# Patient Record
Sex: Female | Born: 1990 | Hispanic: No | Marital: Married | Smoking: Never smoker
Health system: Southern US, Community
[De-identification: ages and names within clinical notes are randomized; demographics above are authoritative.]

## PROBLEM LIST (undated history)

## (undated) ENCOUNTER — Inpatient Hospital Stay (HOSPITAL_COMMUNITY): Payer: Self-pay

## (undated) DIAGNOSIS — Z3493 Encounter for supervision of normal pregnancy, unspecified, third trimester: Secondary | ICD-10-CM

## (undated) DIAGNOSIS — D649 Anemia, unspecified: Secondary | ICD-10-CM

## (undated) HISTORY — PX: NO PAST SURGERIES: SHX2092

---
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
## 2011-09-02 ENCOUNTER — Other Ambulatory Visit (HOSPITAL_COMMUNITY): Payer: Self-pay | Admitting: Obstetrics and Gynecology

## 2011-09-02 ENCOUNTER — Ambulatory Visit (HOSPITAL_COMMUNITY)
Admission: RE | Admit: 2011-09-02 | Discharge: 2011-09-02 | Disposition: A | Discharge status: Home / Self Care | Payer: Medicaid Other | Source: Ambulatory Visit | Attending: Obstetrics and Gynecology | Admitting: Obstetrics and Gynecology

## 2011-09-02 DIAGNOSIS — R1032 Left lower quadrant pain: Secondary | ICD-10-CM | POA: Insufficient documentation

## 2011-09-02 DIAGNOSIS — O209 Hemorrhage in early pregnancy, unspecified: Secondary | ICD-10-CM | POA: Insufficient documentation

## 2011-09-02 DIAGNOSIS — R52 Pain, unspecified: Secondary | ICD-10-CM

## 2011-09-02 DIAGNOSIS — O3680X0 Pregnancy with inconclusive fetal viability, not applicable or unspecified: Secondary | ICD-10-CM | POA: Insufficient documentation

## 2011-10-10 LAB — OB RESULTS CONSOLE HIV ANTIBODY (ROUTINE TESTING): HIV: NONREACTIVE

## 2011-10-10 LAB — OB RESULTS CONSOLE ABO/RH: RH Type: POSITIVE

## 2011-10-10 LAB — OB RESULTS CONSOLE ANTIBODY SCREEN: Antibody Screen: NEGATIVE

## 2011-10-10 LAB — OB RESULTS CONSOLE RUBELLA ANTIBODY, IGM: Rubella: IMMUNE

## 2011-10-10 LAB — OB RESULTS CONSOLE GC/CHLAMYDIA: Gonorrhea: NEGATIVE

## 2011-10-13 LAB — OB RESULTS CONSOLE GBS: GBS: POSITIVE

## 2012-03-21 NOTE — L&D Delivery Note (Signed)
Delivery Note Pt pushed x 1 hr 30 min, for delivery.  At 11:23 PM a viable and healthy female was delivered via Vaginal, Spontaneous Delivery (Presentation: ; R Occiput Posterior).  APGAR: 8, 9; weight P.   Placenta status: Intact, Spontaneous.  Cord:  with the following complications: NONE  Anesthesia: Epidural  Episiotomy: none Lacerations: 1st degree Suture Repair: 3.0 vicryl rapide Est. Blood Loss (mL): 400  Mom to postpartum.  Baby to stay with mommy.  BOVARD,Walsie Smeltz 05/04/2012, 11:50 PM  Br/Bo; B+ ; POPs; RI

## 2012-04-29 ENCOUNTER — Encounter (HOSPITAL_COMMUNITY): Payer: Self-pay | Admitting: *Deleted

## 2012-04-29 ENCOUNTER — Inpatient Hospital Stay (HOSPITAL_COMMUNITY)
Admission: AD | Admit: 2012-04-29 | Discharge: 2012-04-29 | Disposition: A | Discharge status: Home / Self Care | Payer: Medicaid Other | Source: Ambulatory Visit | Attending: Obstetrics and Gynecology | Admitting: Obstetrics and Gynecology

## 2012-04-29 DIAGNOSIS — O479 False labor, unspecified: Secondary | ICD-10-CM | POA: Insufficient documentation

## 2012-04-29 NOTE — Treatment Plan (Signed)
Dr Jackelyn Knife notified of patient.  No cervical change, with continued contractions.  Pt may be discharged home with labor precautions

## 2012-04-29 NOTE — MAU Note (Signed)
Been having contractions since last night

## 2012-05-04 ENCOUNTER — Inpatient Hospital Stay (HOSPITAL_COMMUNITY)
Admission: AD | Admit: 2012-05-04 | Discharge: 2012-05-07 | DRG: 775 | Disposition: A | Discharge status: Home / Self Care | Payer: Medicaid Other | Source: Ambulatory Visit | Attending: Obstetrics and Gynecology | Admitting: Obstetrics and Gynecology

## 2012-05-04 ENCOUNTER — Encounter (HOSPITAL_COMMUNITY): Payer: Self-pay | Admitting: Anesthesiology

## 2012-05-04 ENCOUNTER — Inpatient Hospital Stay (HOSPITAL_COMMUNITY): Payer: Medicaid Other | Admitting: Anesthesiology

## 2012-05-04 DIAGNOSIS — O99892 Other specified diseases and conditions complicating childbirth: Secondary | ICD-10-CM | POA: Diagnosis present

## 2012-05-04 DIAGNOSIS — Z22330 Carrier of Group B streptococcus: Secondary | ICD-10-CM

## 2012-05-04 LAB — CBC
Hemoglobin: 11.4 g/dL — ABNORMAL LOW (ref 12.0–15.0)
MCH: 28.9 pg (ref 26.0–34.0)
RBC: 3.94 MIL/uL (ref 3.87–5.11)
WBC: 14.3 10*3/uL — ABNORMAL HIGH (ref 4.0–10.5)

## 2012-05-04 MED ORDER — LACTATED RINGERS IV SOLN: 500.0000 mL | Freq: Once | INTRAVENOUS | Status: DC

## 2012-05-04 MED ORDER — PHENYLEPHRINE 40 MCG/ML (10ML) SYRINGE FOR IV PUSH (FOR BLOOD PRESSURE SUPPORT)
80.0000 ug | PREFILLED_SYRINGE | INTRAVENOUS | Status: DC | PRN
  Filled 2012-05-04: qty 5
  Filled 2012-05-04: qty 2

## 2012-05-04 MED ORDER — FENTANYL 2.5 MCG/ML BUPIVACAINE 1/10 % EPIDURAL INFUSION (WH - ANES)
INTRAMUSCULAR | Status: DC | PRN
  Administered 2012-05-04: 14 mL/h via EPIDURAL

## 2012-05-04 MED ORDER — OXYCODONE-ACETAMINOPHEN 5-325 MG PO TABS: 1.0000 | ORAL_TABLET | ORAL | Status: DC | PRN

## 2012-05-04 MED ORDER — ONDANSETRON HCL 4 MG/2ML IJ SOLN: 4.0000 mg | Freq: Four times a day (QID) | INTRAMUSCULAR | Status: DC | PRN

## 2012-05-04 MED ORDER — OXYTOCIN 40 UNITS IN LACTATED RINGERS INFUSION - SIMPLE MED: 62.5000 mL/h | INTRAVENOUS | Status: DC

## 2012-05-04 MED ORDER — LIDOCAINE HCL (PF) 1 % IJ SOLN
INTRAMUSCULAR | Status: DC | PRN
  Administered 2012-05-04 (×2): 4 mL

## 2012-05-04 MED ORDER — PHENYLEPHRINE 40 MCG/ML (10ML) SYRINGE FOR IV PUSH (FOR BLOOD PRESSURE SUPPORT)
80.0000 ug | PREFILLED_SYRINGE | INTRAVENOUS | Status: DC | PRN
  Filled 2012-05-04: qty 2

## 2012-05-04 MED ORDER — ACETAMINOPHEN 325 MG PO TABS: 650.0000 mg | ORAL_TABLET | ORAL | Status: DC | PRN

## 2012-05-04 MED ORDER — EPHEDRINE 5 MG/ML INJ
10.0000 mg | INTRAVENOUS | Status: DC | PRN
  Filled 2012-05-04: qty 2

## 2012-05-04 MED ORDER — DIPHENHYDRAMINE HCL 50 MG/ML IJ SOLN: 12.5000 mg | INTRAMUSCULAR | Status: DC | PRN

## 2012-05-04 MED ORDER — EPHEDRINE 5 MG/ML INJ
10.0000 mg | INTRAVENOUS | Status: DC | PRN
  Filled 2012-05-04: qty 4
  Filled 2012-05-04: qty 2

## 2012-05-04 MED ORDER — LIDOCAINE HCL (PF) 1 % IJ SOLN
30.0000 mL | INTRAMUSCULAR | Status: DC | PRN
  Filled 2012-05-04 (×2): qty 30

## 2012-05-04 MED ORDER — CITRIC ACID-SODIUM CITRATE 334-500 MG/5ML PO SOLN: 30.0000 mL | ORAL | Status: DC | PRN

## 2012-05-04 MED ORDER — AMPICILLIN SODIUM 2 G IJ SOLR
2.0000 g | Freq: Four times a day (QID) | INTRAMUSCULAR | Status: DC
  Administered 2012-05-04: 2 g via INTRAVENOUS
  Filled 2012-05-04 (×2): qty 2000

## 2012-05-04 MED ORDER — FENTANYL 2.5 MCG/ML BUPIVACAINE 1/10 % EPIDURAL INFUSION (WH - ANES)
14.0000 mL/h | INTRAMUSCULAR | Status: DC
Start: 2012-05-04 — End: 2012-05-07
  Filled 2012-05-04: qty 125

## 2012-05-04 MED ORDER — LACTATED RINGERS IV SOLN
INTRAVENOUS | Status: DC
  Administered 2012-05-04: 21:00:00 via INTRAVENOUS

## 2012-05-04 MED ORDER — LACTATED RINGERS IV SOLN: 500.0000 mL | INTRAVENOUS | Status: DC | PRN

## 2012-05-04 MED ORDER — OXYTOCIN 40 UNITS IN LACTATED RINGERS INFUSION - SIMPLE MED
1.0000 m[IU]/min | INTRAVENOUS | Status: DC
  Filled 2012-05-04: qty 1000

## 2012-05-04 MED ORDER — OXYTOCIN BOLUS FROM INFUSION
500.0000 mL | INTRAVENOUS | Status: DC
  Administered 2012-05-04: 500 mL via INTRAVENOUS

## 2012-05-04 MED ORDER — OXYTOCIN 10 UNIT/ML IJ SOLN
40.0000 [IU] | INTRAVENOUS | Status: DC
  Filled 2012-05-04: qty 4

## 2012-05-04 MED ORDER — TERBUTALINE SULFATE 1 MG/ML IJ SOLN: 0.2500 mg | Freq: Once | INTRAMUSCULAR | Status: AC | PRN

## 2012-05-04 MED ORDER — IBUPROFEN 600 MG PO TABS
600.0000 mg | ORAL_TABLET | Freq: Four times a day (QID) | ORAL | Status: DC | PRN
  Administered 2012-05-05: 600 mg via ORAL
  Filled 2012-05-04: qty 1

## 2012-05-04 MED ORDER — FLEET ENEMA 7-19 GM/118ML RE ENEM: 1.0000 | ENEMA | RECTAL | Status: DC | PRN

## 2012-05-04 NOTE — H&P (Signed)
[name] is a 22 y.o. female G2P1001 at 64+ with ROM and contractions.  Uncomplicated PNC except consanguinuity (parents are 2nd cousins).  +FM, no VB.    Maternal Medical History:  Reason for admission: Rupture of membranes and contractions.   Contractions: Frequency: regular.   Perceived severity is strong.    Fetal activity: Perceived fetal activity is normal.    Prenatal complications: no prenatal complications   OB History   Grav Para Term Preterm Abortions TAB SAB Ect Mult Living   2 1 1  0 0 0 0 0 0 1    G1 4/12 7#1, female SVD; G2 present; no pap, no STDs  Past Medical History  Diagnosis Date  . Medical history non-contributory    No past surgical history on file. Family History: unknown CA in pgf Social History:  reports that she has never smoked. She has never used smokeless tobacco. She reports that she does not drink alcohol or use illicit drugs.married, [facility]   Prenatal Transfer Tool  Maternal Diabetes: No Genetic Screening: Normal Maternal Ultrasounds/Referrals: Normal Fetal Ultrasounds or other Referrals:  None Maternal Substance Abuse:  No Significant Maternal Medications:  None Significant Maternal Lab Results:  Lab values include: Group B Strep positive Other Comments:  Mother and FOB are 2nd cousins  Review of Systems  Constitutional: Negative.   HENT: Negative.   Eyes: Negative.   Respiratory: Negative.   Cardiovascular: Negative.   Gastrointestinal: Negative.   Genitourinary: Negative.   Musculoskeletal: Negative.   Skin: Negative.   Neurological: Negative.   Psychiatric/Behavioral: Negative.     Dilation: 8 Effacement (%): 100 Station: 0 Exam by:: Rayburn Ma Blood pressure 148/87, temperature 98.4 F (36.9 C), temperature source Oral. Maternal Exam:  Uterine Assessment: Contraction frequency is regular.   Abdomen: Fundal height is appropriate for gestation.   Estimated fetal weight is 7-8#.   Fetal presentation:  vertex  Pelvis: adequate for delivery.   Cervix: Cervix evaluated by digital exam.     Physical Exam  Constitutional: She is oriented to person, place, and time. She appears well-developed and well-nourished.  HENT:  Head: Normocephalic and atraumatic.  Cardiovascular: Normal rate and regular rhythm.   Respiratory: Effort normal and breath sounds normal. No respiratory distress.  GI: Soft. Bowel sounds are normal. There is no tenderness.  Musculoskeletal: Normal range of motion.  Neurological: She is alert and oriented to person, place, and time.  Skin: Skin is warm and dry.  Psychiatric: She has a normal mood and affect. Her behavior is normal.    Prenatal labs: ABO, Rh: B/Positive/-- (07/22 0000) Antibody: Negative (07/22 0000) Rubella: Immune (07/22 0000) RPR: Nonreactive (07/22 0000)  HBsAg: Negative (07/22 0000)  HIV: Non-reactive (07/22 0000)  GBS: Positive (07/25 0000)   Hgb 12.5/ Ur Cx + GBBS/ Plt 309K/ GC neg/ Chl neg/ CF neg/ First Tri and AFP WNL/ glucola WNL  EDC 2/12 - ant plac, nl anat, female  Declines TDAP and flu  Assessment/Plan: 21yo G2P1001 at 40+ in active labor Epidural if possible GBBS + - ampicillin Expect SVD   BOVARD,Destine Zirkle 05/04/2012, 8:02 PM

## 2012-05-04 NOTE — Progress Notes (Signed)
To 169 via stretcher. Family with pt.

## 2012-05-04 NOTE — MAU Note (Signed)
Pt came in to MAU via w/c. Very uncomfortable and water broke on way to room. Mod amt clear fld. Dr Ellyn Hack called by Jeani Sow NP and admit orders received.. BS called and report given to [facility].

## 2012-05-04 NOTE — Anesthesia Preprocedure Evaluation (Signed)
Anesthesia Evaluation  Patient identified by MRN, date of birth, ID band Patient awake    Reviewed: Allergy & Precautions, H&P , Patient's Chart, lab work & pertinent test results  Airway Mallampati: III TM Distance: >3 FB Neck ROM: full    Dental no notable dental hx. (+) Teeth Intact   Pulmonary neg pulmonary ROS,  breath sounds clear to auscultation  Pulmonary exam normal       Cardiovascular negative cardio ROS  Rhythm:regular Rate:Normal     Neuro/Psych negative neurological ROS  negative psych ROS   GI/Hepatic negative GI ROS, Neg liver ROS,   Endo/Other  negative endocrine ROS  Renal/GU negative Renal ROS  negative genitourinary   Musculoskeletal   Abdominal   Peds  Hematology negative hematology ROS (+)   Anesthesia Other Findings   Reproductive/Obstetrics (+) Pregnancy                           Anesthesia Physical Anesthesia Plan  ASA: II  Anesthesia Plan: Epidural   Post-op Pain Management:    Induction:   Airway Management Planned:   Additional Equipment:   Intra-op Plan:   Post-operative Plan:   Informed Consent: I have reviewed the patients History and Physical, chart, labs and discussed the procedure including the risks, benefits and alternatives for the proposed anesthesia with the patient or authorized representative who has indicated his/her understanding and acceptance.     Plan Discussed with: Anesthesiologist  Anesthesia Plan Comments:         Anesthesia Quick Evaluation

## 2012-05-04 NOTE — Progress Notes (Signed)
Monitors off for epidural placement. 

## 2012-05-04 NOTE — Anesthesia Procedure Notes (Signed)
Epidural Patient location during procedure: OB Start time: 05/04/2012 8:25 PM  Staffing Anesthesiologist: Marisa Hufstetler A. Performed by: anesthesiologist   Preanesthetic Checklist Completed: patient identified, site marked, surgical consent, pre-op evaluation, timeout performed, IV checked, risks and benefits discussed and monitors and equipment checked  Epidural Patient position: left lateral decubitus Prep: site prepped and draped and DuraPrep Patient monitoring: continuous pulse ox and blood pressure Approach: midline Injection technique: LOR air  Needle:  Needle type: Tuohy  Needle gauge: 17 G Needle length: 9 cm and 9 Needle insertion depth: 5 cm cm Catheter type: closed end flexible Catheter size: 19 Gauge Catheter at skin depth: 10 cm Test dose: negative and Other  Assessment Events: blood not aspirated, injection not painful, no injection resistance, negative IV test and no paresthesia  Additional Notes Patient identified. Risks and benefits discussed including failed block, incomplete  Pain control, post dural puncture headache, nerve damage, paralysis, blood pressure Changes, nausea, vomiting, reactions to medications-both toxic and allergic and post Partum back pain. All questions were answered. Patient expressed understanding and wished to proceed. Sterile technique was used throughout procedure. Epidural site was Dressed with sterile barrier dressing. No paresthesias, signs of intravascular injection Or signs of intrathecal spread were encountered.  Patient was more comfortable after the epidural was dosed. Please see RN's note for documentation of vital signs and FHR which are stable.

## 2012-05-05 ENCOUNTER — Encounter (HOSPITAL_COMMUNITY): Payer: Self-pay | Admitting: *Deleted

## 2012-05-05 LAB — CBC
HCT: 32.6 % — ABNORMAL LOW (ref 36.0–46.0)
MCHC: 33.4 g/dL (ref 30.0–36.0)
MCV: 84.7 fL (ref 78.0–100.0)
RDW: 14.4 % (ref 11.5–15.5)

## 2012-05-05 MED ORDER — DIBUCAINE 1 % RE OINT
1.0000 "application " | TOPICAL_OINTMENT | RECTAL | Status: DC | PRN
  Administered 2012-05-05: 1 via RECTAL
  Filled 2012-05-05: qty 28

## 2012-05-05 MED ORDER — ONDANSETRON HCL 4 MG PO TABS: 4.0000 mg | ORAL_TABLET | ORAL | Status: DC | PRN

## 2012-05-05 MED ORDER — TETANUS-DIPHTH-ACELL PERTUSSIS 5-2.5-18.5 LF-MCG/0.5 IM SUSP: 0.5000 mL | Freq: Once | INTRAMUSCULAR | Status: DC

## 2012-05-05 MED ORDER — WITCH HAZEL-GLYCERIN EX PADS
1.0000 "application " | MEDICATED_PAD | CUTANEOUS | Status: DC | PRN
  Administered 2012-05-07: 1 via TOPICAL

## 2012-05-05 MED ORDER — LANOLIN HYDROUS EX OINT: TOPICAL_OINTMENT | CUTANEOUS | Status: DC | PRN

## 2012-05-05 MED ORDER — IBUPROFEN 600 MG PO TABS
600.0000 mg | ORAL_TABLET | Freq: Four times a day (QID) | ORAL | Status: DC
  Administered 2012-05-05 – 2012-05-07 (×9): 600 mg via ORAL
  Filled 2012-05-05 (×9): qty 1

## 2012-05-05 MED ORDER — SENNOSIDES-DOCUSATE SODIUM 8.6-50 MG PO TABS
2.0000 | ORAL_TABLET | Freq: Every day | ORAL | Status: DC
  Administered 2012-05-05: 2 via ORAL

## 2012-05-05 MED ORDER — DIPHENHYDRAMINE HCL 25 MG PO CAPS: 25.0000 mg | ORAL_CAPSULE | Freq: Four times a day (QID) | ORAL | Status: DC | PRN

## 2012-05-05 MED ORDER — ONDANSETRON HCL 4 MG/2ML IJ SOLN: 4.0000 mg | INTRAMUSCULAR | Status: DC | PRN

## 2012-05-05 MED ORDER — SIMETHICONE 80 MG PO CHEW: 80.0000 mg | CHEWABLE_TABLET | ORAL | Status: DC | PRN

## 2012-05-05 MED ORDER — OXYCODONE-ACETAMINOPHEN 5-325 MG PO TABS
1.0000 | ORAL_TABLET | ORAL | Status: DC | PRN
  Administered 2012-05-05 – 2012-05-06 (×5): 1 via ORAL
  Filled 2012-05-05 (×5): qty 1

## 2012-05-05 MED ORDER — BUTORPHANOL TARTRATE 1 MG/ML IJ SOLN: 2.0000 mg | INTRAMUSCULAR | Status: DC | PRN

## 2012-05-05 MED ORDER — PRENATAL MULTIVITAMIN CH
1.0000 | ORAL_TABLET | Freq: Every day | ORAL | Status: DC
  Administered 2012-05-05 – 2012-05-07 (×3): 1 via ORAL
  Filled 2012-05-05 (×3): qty 1

## 2012-05-05 MED ORDER — LACTATED RINGERS IV SOLN: INTRAVENOUS | Status: DC

## 2012-05-05 MED ORDER — ZOLPIDEM TARTRATE 5 MG PO TABS: 5.0000 mg | ORAL_TABLET | Freq: Every evening | ORAL | Status: DC | PRN

## 2012-05-05 MED ORDER — BENZOCAINE-MENTHOL 20-0.5 % EX AERO
1.0000 "application " | INHALATION_SPRAY | CUTANEOUS | Status: DC | PRN
  Administered 2012-05-05 – 2012-05-07 (×2): 1 via TOPICAL
  Filled 2012-05-05 (×2): qty 56

## 2012-05-05 NOTE — Progress Notes (Signed)
Post Partum Day 1 Subjective: no complaints, up ad lib, voiding, tolerating PO and nl lochia, pain controlled  Objective: Blood pressure 128/66, pulse 89, temperature 98.9 F (37.2 C), temperature source Oral, resp. rate 18, height 5\' 1"  (1.549 m), weight 78.926 kg (174 lb), SpO2 99.00%, unknown if currently breastfeeding.  Physical Exam:  General: alert and no distress Lochia: appropriate Uterine Fundus: firm   Recent Labs  05/04/12 1933 05/05/12 0515  HGB 11.4* 10.9*  HCT 33.6* 32.6*    Assessment/Plan: Plan for discharge tomorrow, Breastfeeding and Lactation consult.  Doing well.     LOS: 1 day   Evans,Gabriella Haidar 05/05/2012, 7:38 AM

## 2012-05-05 NOTE — Progress Notes (Signed)
O2 at 10 L/min applied Pushing with rope.

## 2012-05-05 NOTE — Progress Notes (Signed)
O2 off

## 2012-05-05 NOTE — Anesthesia Postprocedure Evaluation (Signed)
Anesthesia Post Note  Patient: [name]  Procedure(s) Performed: * No procedures listed *  Anesthesia type: Epidural  Patient location: Mother/Baby  Post pain: Pain level controlled  Post assessment: Post-op Vital signs reviewed  Last Vitals:  Filed Vitals:   05/05/12 0638  BP: 128/66  Pulse: 89  Temp: 37.2 C  Resp: 18    Post vital signs: Reviewed  Level of consciousness:alert  Complications: No apparent anesthesia complications

## 2012-05-06 MED ORDER — IBUPROFEN 800 MG PO TABS: 800.0000 mg | ORAL_TABLET | Freq: Three times a day (TID) | ORAL | Status: DC | PRN

## 2012-05-06 MED ORDER — PRENATAL MULTIVITAMIN CH: 1.0000 | ORAL_TABLET | Freq: Every day | ORAL | Status: DC

## 2012-05-06 MED ORDER — OXYCODONE-ACETAMINOPHEN 5-325 MG PO TABS: 1.0000 | ORAL_TABLET | Freq: Four times a day (QID) | ORAL | Status: DC | PRN

## 2012-05-06 NOTE — Discharge Summary (Addendum)
Obstetric Discharge Summary Reason for Admission: onset of labor Prenatal Procedures: none Intrapartum Procedures: spontaneous vaginal delivery Postpartum Procedures: none Complications-Operative and Postpartum: vaginal laceration Hemoglobin  Date Value Range Status  05/05/2012 10.9* 12.0 - 15.0 g/dL Final     HCT  Date Value Range Status  05/05/2012 32.6* 36.0 - 46.0 % Final    Physical Exam:  General: alert and no distress Lochia: appropriate Uterine Fundus: firm Perineum - swollen  Discharge Diagnoses: Term Pregnancy-delivered  Discharge Information: Date: 05/06/2012 Activity: pelvic rest Diet: routine Medications: PNV, Ibuprofen and Percocet Condition: stable Instructions: refer to practice specific booklet Discharge to: home Follow-up Information   Follow up with BOVARD,Olanna Percifield, MD. Schedule an appointment as soon as possible for a visit in 6 weeks.   Contact information:   [address] [place] [address] [phone]       Newborn Data: Live born female  Birth Weight: 7 lb 15 oz (3600 g) APGAR: 8, 9  Home with mother.  BOVARD,Timithy Arons 05/06/2012, 10:02 AM  Baby staying as baby patient, mother stayed with continued voiding dysfunction.  Replaced foley.  Will d/c 2/17 - if unable to void will d/c with catheter and macrobid daily.  Attempt to d/c tomorrow

## 2012-05-06 NOTE — Progress Notes (Addendum)
Post Partum Day 2 Subjective: no complaints, up ad lib, tolerating PO and nl lochia, pain controlled.  Difficulty voiding yesterday, foley replaced.  Trial before d/c today. states feels much better today.    Objective: Blood pressure 113/69, pulse 87, temperature 97.8 F (36.6 C), temperature source Oral, resp. rate 18, height 5\' 1"  (1.549 m), weight 78.926 kg (174 lb), SpO2 99.00%, unknown if currently breastfeeding.  Physical Exam:  General: alert and no distress Lochia: appropriate Uterine Fundus: firm   Recent Labs  05/04/12 1933 05/05/12 0515  HGB 11.4* 10.9*  HCT 33.6* 32.6*    Assessment/Plan: Discharge home, Breastfeeding and Lactation consult.  Baby not d/c'd, if patient can void, will stay as baby patient.  D/C with motrin/percocet/pnv, f/u 6 weeks.     LOS: 2 days   BOVARD,Rana Adorno 05/06/2012, 9:10 AM

## 2012-05-07 MED ORDER — NITROFURANTOIN MONOHYD MACRO 100 MG PO CAPS
100.0000 mg | ORAL_CAPSULE | ORAL | Status: DC
  Filled 2012-05-07 (×2): qty 1

## 2012-05-07 MED ORDER — NITROFURANTOIN MONOHYD MACRO 100 MG PO CAPS: 100.0000 mg | ORAL_CAPSULE | ORAL | Status: DC

## 2012-05-07 NOTE — Progress Notes (Signed)
Post Partum Day 3 Subjective: no complaints, up ad lib, tolerating PO and nl lochia, pain controlled, still with voiding dysfunction  Objective: Blood pressure 121/77, pulse 75, temperature 98.5 F (36.9 C), temperature source Oral, resp. rate 18, height 5\' 1"  (1.549 m), weight 78.926 kg (174 lb), SpO2 96.00%, unknown if currently breastfeeding.  Physical Exam:  General: alert and no distress Lochia: appropriate Uterine Fundus: firm    Recent Labs  05/04/12 1933 05/05/12 0515  HGB 11.4* 10.9*  HCT 33.6* 32.6*    Assessment/Plan: Discharge home with foley catheter if necessary.  Lactation consult/ breastfeeding.  D/c with motrin/percocet/pnv/macrobid.  F/u in 1-2 days.  Then 6 weeks for pp check.     LOS: 3 days   BOVARD,Adalaide Jaskolski 05/07/2012, 8:46 AM

## 2012-05-07 NOTE — Plan of Care (Signed)
Problem: Phase I Progression Outcomes Goal: Voiding adequately Outcome: Not Met (add Reason) Pt unable to void.  Patient being discharged with foley catheter, leg bag and instructions on how to remove on 03/07/2013.

## 2012-05-07 NOTE — Progress Notes (Signed)
Post discharge review completed. 

## 2012-05-18 ENCOUNTER — Telehealth (HOSPITAL_COMMUNITY): Payer: Self-pay | Admitting: *Deleted

## 2012-05-18 NOTE — Telephone Encounter (Signed)
Resolve episode 

## 2013-09-19 ENCOUNTER — Ambulatory Visit: Payer: Medicaid Other

## 2014-01-20 ENCOUNTER — Encounter (HOSPITAL_COMMUNITY): Payer: Self-pay | Admitting: *Deleted

## 2015-07-17 ENCOUNTER — Inpatient Hospital Stay (HOSPITAL_COMMUNITY): Payer: Medicaid Other

## 2015-07-17 ENCOUNTER — Encounter (HOSPITAL_COMMUNITY): Payer: Self-pay

## 2015-07-17 ENCOUNTER — Inpatient Hospital Stay (HOSPITAL_COMMUNITY)
Admission: AD | Admit: 2015-07-17 | Discharge: 2015-07-17 | Disposition: A | Discharge status: Home / Self Care | Payer: Medicaid Other | Source: Ambulatory Visit | Attending: Obstetrics and Gynecology | Admitting: Obstetrics and Gynecology

## 2015-07-17 DIAGNOSIS — O039 Complete or unspecified spontaneous abortion without complication: Secondary | ICD-10-CM

## 2015-07-17 DIAGNOSIS — O200 Threatened abortion: Secondary | ICD-10-CM | POA: Diagnosis not present

## 2015-07-17 DIAGNOSIS — O209 Hemorrhage in early pregnancy, unspecified: Secondary | ICD-10-CM

## 2015-07-17 DIAGNOSIS — O034 Incomplete spontaneous abortion without complication: Secondary | ICD-10-CM

## 2015-07-17 DIAGNOSIS — Z3A09 9 weeks gestation of pregnancy: Secondary | ICD-10-CM | POA: Diagnosis not present

## 2015-07-17 LAB — CBC
HEMATOCRIT: 34.2 % — AB (ref 36.0–46.0)
Hemoglobin: 11.8 g/dL — ABNORMAL LOW (ref 12.0–15.0)
MCH: 29.6 pg (ref 26.0–34.0)
MCHC: 34.5 g/dL (ref 30.0–36.0)
MCV: 85.9 fL (ref 78.0–100.0)
PLATELETS: 240 10*3/uL (ref 150–400)
RBC: 3.98 MIL/uL (ref 3.87–5.11)
RDW: 12.3 % (ref 11.5–15.5)
WBC: 12.5 10*3/uL — AB (ref 4.0–10.5)

## 2015-07-17 LAB — URINE MICROSCOPIC-ADD ON: WBC, UA: NONE SEEN WBC/hpf (ref 0–5)

## 2015-07-17 LAB — URINALYSIS, ROUTINE W REFLEX MICROSCOPIC
BILIRUBIN URINE: NEGATIVE
Glucose, UA: NEGATIVE mg/dL
KETONES UR: NEGATIVE mg/dL
Leukocytes, UA: NEGATIVE
NITRITE: NEGATIVE
Protein, ur: NEGATIVE mg/dL
SPECIFIC GRAVITY, URINE: 1.02 (ref 1.005–1.030)
pH: 6.5 (ref 5.0–8.0)

## 2015-07-17 LAB — WET PREP, GENITAL
Clue Cells Wet Prep HPF POC: NONE SEEN
Sperm: NONE SEEN
TRICH WET PREP: NONE SEEN
YEAST WET PREP: NONE SEEN

## 2015-07-17 LAB — HCG, QUANTITATIVE, PREGNANCY: hCG, Beta Chain, Quant, S: 19987 m[IU]/mL — ABNORMAL HIGH (ref <5)

## 2015-07-17 LAB — POCT PREGNANCY, URINE: PREG TEST UR: POSITIVE — AB

## 2015-07-17 MED ORDER — MISOPROSTOL 200 MCG PO TABS: 800.0000 ug | ORAL_TABLET | Freq: Once | ORAL | Status: DC

## 2015-07-17 MED ORDER — HYDROCODONE-ACETAMINOPHEN 5-325 MG PO TABS: 1.0000 | ORAL_TABLET | ORAL | Status: DC | PRN

## 2015-07-17 NOTE — Discharge Instructions (Signed)
Miscarriage  A miscarriage is the sudden loss of an unborn baby (fetus) before the 20th week of pregnancy. Most miscarriages happen in the first 3 months of pregnancy. Sometimes, it happens before a woman even knows she is pregnant. A miscarriage is also called a "spontaneous miscarriage" or "early pregnancy loss." Having a miscarriage can be an emotional experience. Talk with your caregiver about any questions you may have about miscarrying, the grieving process, and your future pregnancy plans.  CAUSES    Problems with the fetal chromosomes that make it impossible for the baby to develop normally. Problems with the baby's genes or chromosomes are most often the result of errors that occur, by chance, as the embryo divides and grows. The problems are not inherited from the parents.   Infection of the cervix or uterus.    Hormone problems.    Problems with the cervix, such as having an incompetent cervix. This is when the tissue in the cervix is not strong enough to hold the pregnancy.    Problems with the uterus, such as an abnormally shaped uterus, uterine fibroids, or congenital abnormalities.    Certain medical conditions.    Smoking, drinking alcohol, or taking illegal drugs.    Trauma.   Often, the cause of a miscarriage is unknown.   SYMPTOMS    Vaginal bleeding or spotting, with or without cramps or pain.   Pain or cramping in the abdomen or lower back.   Passing fluid, tissue, or blood clots from the vagina.  DIAGNOSIS   Your caregiver will perform a physical exam. You may also have an ultrasound to confirm the miscarriage. Blood or urine tests may also be ordered.  TREATMENT    Sometimes, treatment is not necessary if you naturally pass all the fetal tissue that was in the uterus. If some of the fetus or placenta remains in the body (incomplete miscarriage), tissue left behind may become infected and must be removed. Usually, a dilation and curettage (D and C) procedure is performed.  During a D and C procedure, the cervix is widened (dilated) and any remaining fetal or placental tissue is gently removed from the uterus.   Antibiotic medicines are prescribed if there is an infection. Other medicines may be given to reduce the size of the uterus (contract) if there is a lot of bleeding.   If you have Rh negative blood and your baby was Rh positive, you will need a Rh immunoglobulin shot. This shot will protect any future baby from having Rh blood problems in future pregnancies.  HOME CARE INSTRUCTIONS    Your caregiver may order bed rest or may allow you to continue light activity. Resume activity as directed by your caregiver.   Have someone help with home and family responsibilities during this time.    Keep track of the number of sanitary pads you use each day and how soaked (saturated) they are. Write down this information.    Do not use tampons. Do not douche or have sexual intercourse until approved by your caregiver.    Only take over-the-counter or prescription medicines for pain or discomfort as directed by your caregiver.    Do not take aspirin. Aspirin can cause bleeding.    Keep all follow-up appointments with your caregiver.    If you or your partner have problems with grieving, talk to your caregiver or seek counseling to help cope with the pregnancy loss. Allow enough time to grieve before trying to get pregnant again.     SEEK IMMEDIATE MEDICAL CARE IF:    You have severe cramps or pain in your back or abdomen.   You have a fever.   You pass large blood clots (walnut-sized or larger) ortissue from your vagina. Save any tissue for your caregiver to inspect.    Your bleeding increases.    You have a thick, bad-smelling vaginal discharge.   You become lightheaded, weak, or you faint.    You have chills.   MAKE SURE YOU:   Understand these instructions.   Will watch your condition.   Will get help right away if you are not doing well or get worse.     This  information is not intended to replace advice given to you by your health care provider. Make sure you discuss any questions you have with your health care provider.     Document Released: 08/31/2000 Document Revised: 07/02/2012 Document Reviewed: 04/26/2011  Elsevier Interactive Patient Education 2016 [employer]

## 2015-07-17 NOTE — MAU Note (Signed)
Have had off and on cramping all day. Tonight when i wiped saw bright red on tissue. Few hours later saw some blood on panties. Have lower back pain as well

## 2015-07-17 NOTE — MAU Provider Note (Signed)
History     CSN: [id number]  Arrival date and time: 07/17/15 2039   First Provider Initiated Contact with Patient 07/17/15 2119      Chief Complaint  Patient presents with  . Abdominal Pain  . Vaginal Bleeding  . Back Pain   HPI   Ms [name] is a 25 y.o. female G3P2002 at [redacted]w[redacted]d presenting to MAU with vaginal bleeding, lower back cramping, and lower abdominal pain. The bleeding is described as light, however heavier than spotting. The color is bright red.   No recent intercourse.  Lower back pain is rated at 5/10 the pain comes and goes. The pain is located on both sides. [name] has not taken anything for the pain.   OB History    Gravida Para Term Preterm AB TAB SAB Ectopic Multiple Living   0 0 0 0 0 0 2      Past Medical History  Diagnosis Date  . Medical history non-contributory   . SVD (spontaneous vaginal delivery) 05/04/2012    History reviewed. No pertinent past surgical history.  History reviewed. No pertinent family history.  Social History  Substance Use Topics  . Smoking status: Never Smoker   . Smokeless tobacco: Never Used  . Alcohol Use: No    Allergies: No Known Allergies  Prescriptions prior to admission  Medication Sig Dispense Refill Last Dose  . Prenatal Vit-Fe Fumarate-FA (PRENATAL MULTIVITAMIN) TABS Take 1 tablet by mouth daily. 30 tablet 12 07/17/2015 at Unknown time   Results for orders placed or performed during the hospital encounter of 07/17/15 (from the past 48 hour(s))  Urinalysis, Routine w reflex microscopic (not at [facility])     Status: Abnormal   Collection Time: 07/17/15  8:55 PM  Result Value Ref Range   Color, Urine YELLOW YELLOW   APPearance CLEAR CLEAR   Specific Gravity, Urine 1.020 1.005 - 1.030   pH 6.5 5.0 - 8.0   Glucose, UA NEGATIVE NEGATIVE mg/dL   Hgb urine dipstick SMALL (A) NEGATIVE   Bilirubin Urine NEGATIVE NEGATIVE   Ketones, ur NEGATIVE NEGATIVE mg/dL   Protein, ur NEGATIVE NEGATIVE mg/dL   Nitrite  NEGATIVE NEGATIVE   Leukocytes, UA NEGATIVE NEGATIVE  Urine microscopic-add on     Status: Abnormal   Collection Time: 07/17/15  8:55 PM  Result Value Ref Range   Squamous Epithelial / LPF 0-5 (A) NONE SEEN   WBC, UA NONE SEEN 0 - 5 WBC/hpf   RBC / HPF 0-5 0 - 5 RBC/hpf   Bacteria, UA FEW (A) NONE SEEN  Pregnancy, urine POC     Status: Abnormal   Collection Time: 07/17/15  9:11 PM  Result Value Ref Range   Preg Test, Ur POSITIVE (A) NEGATIVE    Comment:        THE SENSITIVITY OF THIS METHODOLOGY IS >24 mIU/mL   Wet prep, genital     Status: Abnormal   Collection Time: 07/17/15  9:26 PM  Result Value Ref Range   Yeast Wet Prep HPF POC NONE SEEN NONE SEEN   Trich, Wet Prep NONE SEEN NONE SEEN   Clue Cells Wet Prep HPF POC NONE SEEN NONE SEEN   WBC, Wet Prep HPF POC MODERATE (A) NONE SEEN    Comment: MODERATE BACTERIA SEEN   Sperm NONE SEEN   CBC     Status: Abnormal   Collection Time: 07/17/15  9:28 PM  Result Value Ref Range   WBC 12.5 (H) 4.0 - 10.5  K/uL   RBC 3.98 3.87 - 5.11 MIL/uL   Hemoglobin 11.8 (L) 12.0 - 15.0 g/dL   HCT 40.9 (L) 81.1 - 91.4 %   MCV 85.9 78.0 - 100.0 fL   MCH 29.6 26.0 - 34.0 pg   MCHC 34.5 30.0 - 36.0 g/dL   RDW 78.2 95.6 - 21.3 %   Platelets 240 150 - 400 K/uL   US Ob Comp Less 14 Wks  07/17/2015  CLINICAL DATA:  Acute onset of vaginal bleeding.  Initial encounter. EXAM: OBSTETRIC <14 WK [place] AND TRANSVAGINAL OB US TECHNIQUE: Both transabdominal and transvaginal ultrasound examinations were performed for complete evaluation of the gestation as well as the maternal uterus, adnexal regions, and pelvic cul-de-sac. Transvaginal technique was performed to assess early pregnancy. COMPARISON:  Pelvic ultrasound performed 09/02/2011 FINDINGS: Intrauterine gestational sac: Visualized; normal in shape. Yolk sac:  Yes Embryo:  Yes Cardiac Activity: No Heart Rate: N/A CRL:  11.2  mm   7 w   1 d                  [place] EDC: 03/03/2016 Subchorionic hemorrhage: A small  amount of subchorionic hemorrhage is noted. Maternal uterus/adnexae: The uterus is otherwise unremarkable. The ovaries are within normal limits. The right ovary measures 3.3 x 2.0 x 2.2 cm, while the left ovary measures 2.5 x 2.3 x 1.9 cm. No suspicious adnexal masses are seen; there is no evidence for ovarian torsion. No free fluid is seen within the pelvic cul-de-sac. IMPRESSION: 1. Single intrauterine pregnancy noted, with an estimated gestational age of [redacted] weeks 1 day. No cardiac activity is seen, despite the crown-rump length of 1.1 cm, compatible with fetal demise. 2. Small amount of subchorionic hemorrhage noted. Electronically Signed   By: Roanna Raider M.D.   On: 07/17/2015 22:10   US Ob Transvaginal  07/17/2015  CLINICAL DATA:  Acute onset of vaginal bleeding.  Initial encounter. EXAM: OBSTETRIC <14 WK [place] AND TRANSVAGINAL OB US TECHNIQUE: Both transabdominal and transvaginal ultrasound examinations were performed for complete evaluation of the gestation as well as the maternal uterus, adnexal regions, and pelvic cul-de-sac. Transvaginal technique was performed to assess early pregnancy. COMPARISON:  Pelvic ultrasound performed 09/02/2011 FINDINGS: Intrauterine gestational sac: Visualized; normal in shape. Yolk sac:  Yes Embryo:  Yes Cardiac Activity: No Heart Rate: N/A CRL:  11.2  mm   7 w   1 d                  [place] EDC: 03/03/2016 Subchorionic hemorrhage: A small amount of subchorionic hemorrhage is noted. Maternal uterus/adnexae: The uterus is otherwise unremarkable. The ovaries are within normal limits. The right ovary measures 3.3 x 2.0 x 2.2 cm, while the left ovary measures 2.5 x 2.3 x 1.9 cm. No suspicious adnexal masses are seen; there is no evidence for ovarian torsion. No free fluid is seen within the pelvic cul-de-sac. IMPRESSION: 1. Single intrauterine pregnancy noted, with an estimated gestational age of [redacted] weeks 1 day. No cardiac activity is seen, despite the crown-rump length of 1.1 cm,  compatible with fetal demise. 2. Small amount of subchorionic hemorrhage noted. Electronically Signed   By: Roanna Raider M.D.   On: 07/17/2015 22:10    Review of Systems  Constitutional: Negative for fever and chills.  Gastrointestinal: Positive for abdominal pain. Negative for nausea and vomiting.  Genitourinary: Negative for dysuria.  Musculoskeletal: Positive for back pain.   Physical Exam   Blood pressure 131/82, pulse 96,  temperature 98.3 F (36.8 C), resp. rate 18, height 5\' 2"  (1.575 m), weight 145 lb 6.4 oz (65.953 kg), last menstrual period 05/11/2015, unknown if currently breastfeeding.  Physical Exam  Constitutional: [name] is oriented to person, place, and time. [name] appears well-developed and well-nourished.  HENT:  Head: Normocephalic.  Eyes: Pupils are equal, round, and reactive to light.  Respiratory: Effort normal.  GI: Soft. [name] exhibits no distension. There is no tenderness. There is no rebound.  Genitourinary:  Speculum exam: Vagina - Small amount of mucoid, pink/brown discharge, no odor Cervix - No contact bleeding, no active bleeding  Bimanual exam: Cervix closed Uterus non tender, normal size Adnexa non tender, no masses bilaterally Chaperone present for exam.  Musculoskeletal: Normal range of motion.  Neurological: [name] is alert and oriented to person, place, and time.  Skin: Skin is warm.  Psychiatric: Her behavior is normal.    MAU Course  Procedures  None  MDM CBC Hcg US B positive blood type   Discussed watchful waiting, cytotec administration at home, or follow up in the office to discuss D&E. Patient alone today in MAU and not ready to make any definitive decisions. Patient would like RX for cytotec and if ok with significant other, [name] will place at home. [name].   Early Intrauterine Pregnancy Failure Protocol X  Documented intrauterine pregnancy failure less than or equal to [redacted] weeks   gestation  X  No  serious current illness  X  Baseline Hgb greater than or equal to 10g/dl  X  Patient has easily accessible transportation to the hospital  X  Clear preference  X  Practitioner/physician deems patient reliable  X  Counseling by practitioner or physician  X  Patient education by RN  X  Consent form signed       Rho-Gam given by RN if indicated  X  Medication dispensed  X  Cytotec 800 mcg Intravaginally by patient at home       Intravaginally by NP in MAU       Rectally by patient at home       Rectally by RN in MAU  X_   Vicodin #10 no refill   Reviewed with pt cytotec procedure.  Pt verbalizes that [name] lives close to the hospital and has transportation readily available.  Pt appears reliable and verbalizes understanding and agrees with plan of care. Discussed patient with Dr. Jackelyn KnifeMeisinger prior to DC home.   Assessment and Plan   A:  1. Vaginal bleeding in pregnancy, first trimester   2. Inevitable spontaneous abortion     P:  Discharge home in stable condition RX: Cytotec, vicodin Return to MAU if symptoms worsen Call OB on Monday for follow up  Bleeding precautions Support given     Duane LopeJennifer I Rasch, NP 07/17/2015 9:20 PM

## 2015-07-18 LAB — HIV ANTIBODY (ROUTINE TESTING W REFLEX): HIV Screen 4th Generation wRfx: NONREACTIVE

## 2015-07-20 LAB — GC/CHLAMYDIA PROBE AMP (~~LOC~~) NOT AT ARMC
Chlamydia: NEGATIVE
NEISSERIA GONORRHEA: NEGATIVE

## 2016-03-30 ENCOUNTER — Emergency Department (HOSPITAL_BASED_OUTPATIENT_CLINIC_OR_DEPARTMENT_OTHER)
Admission: EM | Admit: 2016-03-30 | Discharge: 2016-03-30 | Disposition: A | Discharge status: Home / Self Care | Payer: Medicaid Other | Attending: Emergency Medicine | Admitting: Emergency Medicine

## 2016-03-30 ENCOUNTER — Encounter (HOSPITAL_BASED_OUTPATIENT_CLINIC_OR_DEPARTMENT_OTHER): Payer: Self-pay

## 2016-03-30 ENCOUNTER — Emergency Department (HOSPITAL_BASED_OUTPATIENT_CLINIC_OR_DEPARTMENT_OTHER): Payer: Medicaid Other

## 2016-03-30 DIAGNOSIS — R42 Dizziness and giddiness: Secondary | ICD-10-CM | POA: Insufficient documentation

## 2016-03-30 DIAGNOSIS — R079 Chest pain, unspecified: Secondary | ICD-10-CM | POA: Insufficient documentation

## 2016-03-30 LAB — BASIC METABOLIC PANEL
Anion gap: 7 (ref 5–15)
BUN: 12 mg/dL (ref 6–20)
CALCIUM: 9.5 mg/dL (ref 8.9–10.3)
CO2: 27 mmol/L (ref 22–32)
CREATININE: 0.65 mg/dL (ref 0.44–1.00)
Chloride: 104 mmol/L (ref 101–111)
GFR calc Af Amer: >60 mL/min (ref >60)
Glucose, Bld: 94 mg/dL (ref 65–99)
Potassium: 4 mmol/L (ref 3.5–5.1)
Sodium: 138 mmol/L (ref 135–145)

## 2016-03-30 LAB — URINALYSIS, MICROSCOPIC (REFLEX)

## 2016-03-30 LAB — URINALYSIS, ROUTINE W REFLEX MICROSCOPIC
BILIRUBIN URINE: NEGATIVE
GLUCOSE, UA: NEGATIVE mg/dL
HGB URINE DIPSTICK: NEGATIVE
Ketones, ur: NEGATIVE mg/dL
Nitrite: NEGATIVE
PROTEIN: NEGATIVE mg/dL
Specific Gravity, Urine: 1.021 (ref 1.005–1.030)
pH: 8 (ref 5.0–8.0)

## 2016-03-30 LAB — PREGNANCY, URINE: PREG TEST UR: NEGATIVE

## 2016-03-30 LAB — CBC
HCT: 38.3 % (ref 36.0–46.0)
Hemoglobin: 13 g/dL (ref 12.0–15.0)
MCH: 29.8 pg (ref 26.0–34.0)
MCHC: 33.9 g/dL (ref 30.0–36.0)
MCV: 87.8 fL (ref 78.0–100.0)
PLATELETS: 289 10*3/uL (ref 150–400)
RBC: 4.36 MIL/uL (ref 3.87–5.11)
RDW: 11.4 % — AB (ref 11.5–15.5)
WBC: 9.5 10*3/uL (ref 4.0–10.5)

## 2016-03-30 NOTE — Discharge Instructions (Signed)
Please read and follow all provided instructions.  Your diagnoses today include:  1. Nonspecific chest pain     Tests performed today include:  An EKG of your heart  A chest x-ray  Blood counts and electrolytes  Urine test  Vital signs. See below for your results today.   Medications prescribed:  Over-the-counter Tylenol or Ibuprofen  Take any prescribed medications only as directed.  Follow-up instructions: See your primary care doctor if symptoms last for more than 1 week.   Return instructions:  SEEK IMMEDIATE MEDICAL ATTENTION IF:  You have severe chest pain, especially if the pain is crushing or pressure-like and spreads to the arms, back, neck, or jaw, or if you have sweating, nausea (feeling sick to your stomach), or shortness of breath. THIS IS AN EMERGENCY. Don't wait to see if the pain will go away. Get medical help at once. Call 911 or 0 (operator). DO NOT drive yourself to the hospital.   Your chest pain gets worse and does not go away with rest.   You have an attack of chest pain lasting longer than usual, despite rest and treatment with the medications your caregiver has prescribed.   You wake from sleep with chest pain or shortness of breath.  You feel dizzy or faint.  You have chest pain not typical of your usual pain for which you originally saw your caregiver.   You have any other emergent concerns regarding your health.  Additional Information: Chest pain comes from many different causes. Your caregiver has diagnosed you as having chest pain that is not specific for one problem, but does not require admission.  You are at low risk for an acute heart condition or other serious illness.   Your vital signs today were: BP 147/87    Pulse 86    Resp 16    LMP 03/23/2016    SpO2 99%  If your blood pressure (BP) was elevated above 135/85 this visit, please have this repeated by your doctor within one month. --------------

## 2016-03-30 NOTE — ED Provider Notes (Signed)
[facility] Provider Note   CSN: [id number] Arrival date & time: 03/30/16  1243     History   Chief Complaint Chief Complaint  Patient presents with  . Chest Pain    HPI [name] is a 26 y.o. female.  Patient with history of anxiety presents with complaint of chest pain ongoing for approximately 24 hours. Patient describes a pressure in her left chest with radiation to her left shoulder blade and left shoulder. This is waxing and waning. Patient reports some shortness of breath but denies pleuritic chest pain, hemoptysis. She is on oral contraceptives but denies any lower extremity swelling or tenderness. Patient denies other risk factors for pulmonary embolism including: unilateral leg swelling, history of DVT/PE/other blood clots, ecent immobilizations, recent surgery, recent travel (>4hr segment), malignancy, hemoptysis. She has had some lightheadedness but no full syncope. No nausea, vomiting, or diarrhea. No history of heavy bleeding. Onset of symptoms acute. Course is constant. Nothing makes symptoms better worse. No family history of cardiac disease at young age. No personal history of arrhythmia. No treatments prior to arrival. She admits to being under a lot of stress recently.      Past Medical History:  Diagnosis Date  . Medical history non-contributory   . SVD (spontaneous vaginal delivery) 05/04/2012    There are no active problems to display for this patient.   History reviewed. No pertinent surgical history.  OB History    Gravida Para Term Preterm AB Living   3 2 2  0 0 2   SAB TAB Ectopic Multiple Live Births   0 0 0 0 1       Home Medications    Prior to Admission medications   Not on File    Family History No family history on file.  Social History Social History  Substance Use Topics  . Smoking status: Never Smoker  . Smokeless tobacco: Never Used  . Alcohol use No     Allergies   Patient has no known  allergies.   Review of Systems Review of Systems  Constitutional: Negative for diaphoresis and fever.  Eyes: Negative for redness.  Respiratory: Negative for cough and shortness of breath.   Cardiovascular: Positive for chest pain. Negative for palpitations and leg swelling.  Gastrointestinal: Negative for abdominal pain, nausea and vomiting.  Genitourinary: Negative for dysuria.  Musculoskeletal: Negative for back pain and neck pain.  Skin: Negative for rash.  Neurological: Positive for light-headedness. Negative for syncope.  Psychiatric/Behavioral: The patient is nervous/anxious.      Physical Exam Updated Vital Signs BP 147/87   Pulse 86   Resp 16   LMP 03/23/2016   SpO2 99%   Physical Exam  Constitutional: She appears well-developed and well-nourished.  HENT:  Head: Normocephalic and atraumatic.  Mouth/Throat: Oropharynx is clear and moist and mucous membranes are normal. Mucous membranes are not dry.  Eyes: Conjunctivae are normal. Right eye exhibits no discharge. Left eye exhibits no discharge.  Neck: Trachea normal and normal range of motion. Neck supple. Normal carotid pulses and no JVD present. No muscular tenderness present. Carotid bruit is not present. No tracheal deviation present.  Cardiovascular: Normal rate, regular rhythm, S1 normal, S2 normal, normal heart sounds and intact distal pulses.  Exam reveals no decreased pulses.   No murmur heard. Pulmonary/Chest: Effort normal and breath sounds normal. No respiratory distress. She has no wheezes. She exhibits no tenderness.  Abdominal: Soft. Normal aorta and bowel sounds are normal. There is no tenderness.  There is no rebound and no guarding.  Musculoskeletal: Normal range of motion.  Neurological: She is alert.  Skin: Skin is warm and dry. She is not diaphoretic. No cyanosis. No pallor.  Psychiatric: She has a normal mood and affect.  Nursing note and vitals reviewed.    ED Treatments / Results  Labs (all  labs ordered are listed, but only abnormal results are displayed) Labs Reviewed  CBC - Abnormal; Notable for the following:       Result Value   RDW 11.4 (*)    All other components within normal limits  URINALYSIS, ROUTINE W REFLEX MICROSCOPIC - Abnormal; Notable for the following:    APPearance CLOUDY (*)    Leukocytes, UA SMALL (*)    All other components within normal limits  URINALYSIS, MICROSCOPIC (REFLEX) - Abnormal; Notable for the following:    Bacteria, UA FEW (*)    Squamous Epithelial / LPF 6-30 (*)    All other components within normal limits  BASIC METABOLIC PANEL  PREGNANCY, URINE    EKG  EKG Interpretation  Date/Time:  Wednesday March 30 2016 13:02:42 EST Ventricular Rate:  90 PR Interval:    QRS Duration: 93 QT Interval:  344 QTC Calculation: 421 R Axis:   87 Text Interpretation:  Sinus rhythm Confirmed by Particia Nearing MD, JULIE ([id number]) on 03/30/2016 1:05:01 PM       Radiology Dg Chest 2 View  Result Date: 03/30/2016 CLINICAL DATA:  Chest pain. EXAM: CHEST  2 VIEW COMPARISON:  None. FINDINGS: The heart size and mediastinal contours are within normal limits. Both lungs are clear. No pneumothorax or pleural effusion is noted. The visualized skeletal structures are unremarkable. IMPRESSION: No active cardiopulmonary disease. Electronically Signed   By: Lupita Raider, M.D.   On: 03/30/2016 13:44    Procedures Procedures (including critical care time)  Medications Ordered in ED Medications - No data to display   Initial Impression / Assessment and Plan / ED Course  I have reviewed the triage vital signs and the nursing notes.  Pertinent labs & imaging results that were available during my care of the patient were reviewed by me and considered in my medical decision making (see chart for details).  Clinical Course    Patient seen and examined. Work-up initiated. EKG reviewed by myself.   Vital signs reviewed and are as follows: BP 147/87   Pulse 86    Resp 16   LMP 03/23/2016   SpO2 99%   2:09 PM Patient updated on all results.   Encouraged PCP f/u if symptoms do not improve. Patient was counseled to return with severe chest pain, especially if the pain is crushing or pressure-like and spreads to the arms, back, neck, or jaw, or if they have sweating, nausea, or shortness of breath with the pain. They were encouraged to call 911 with these symptoms.   They were also told to return if their chest pain gets worse and does not go away with rest, they have an attack of chest pain lasting longer than usual despite rest and treatment with the medications their caregiver has prescribed, if they wake from sleep with chest pain or shortness of breath, if they feel dizzy or faint, if they have chest pain not typical of their usual pain, or if they have any other emergent concerns regarding their health.  The patient verbalized understanding and agreed.     Final Clinical Impressions(s) / ED Diagnoses   Final diagnoses:  Nonspecific chest pain  Patient with chest pains, intermittent, atypical for ACS. Feel patient is low risk for ACS given history (poor story for ACS/MI), normal EKG. She does not have significant risk factor profile. Low concern for PE without peuritic chest pain, vital signs abnormality, other risk factors or LE exam findings. Conservative measures indicated at this time.    New Prescriptions New Prescriptions   No medications on file     Renne Crigler, PA-C 03/30/16 1417    Jacalyn Lefevre, MD 03/30/16 [phone]

## 2016-03-30 NOTE — ED Triage Notes (Signed)
CP x 2 days-NAD-steady gait 

## 2016-05-21 ENCOUNTER — Encounter (HOSPITAL_COMMUNITY): Payer: Self-pay

## 2017-01-22 ENCOUNTER — Emergency Department (HOSPITAL_COMMUNITY)
Admission: EM | Admit: 2017-01-22 | Discharge: 2017-01-22 | Disposition: A | Discharge status: Home / Self Care | Payer: No Typology Code available for payment source | Attending: Emergency Medicine | Admitting: Emergency Medicine

## 2017-01-22 ENCOUNTER — Encounter (HOSPITAL_COMMUNITY): Payer: Self-pay | Admitting: Emergency Medicine

## 2017-01-22 DIAGNOSIS — S46812A Strain of other muscles, fascia and tendons at shoulder and upper arm level, left arm, initial encounter: Secondary | ICD-10-CM

## 2017-01-22 DIAGNOSIS — Y92410 Unspecified street and highway as the place of occurrence of the external cause: Secondary | ICD-10-CM | POA: Insufficient documentation

## 2017-01-22 DIAGNOSIS — Y999 Unspecified external cause status: Secondary | ICD-10-CM | POA: Diagnosis not present

## 2017-01-22 DIAGNOSIS — M549 Dorsalgia, unspecified: Secondary | ICD-10-CM | POA: Insufficient documentation

## 2017-01-22 DIAGNOSIS — Y9389 Activity, other specified: Secondary | ICD-10-CM | POA: Diagnosis not present

## 2017-01-22 DIAGNOSIS — S199XXA Unspecified injury of neck, initial encounter: Secondary | ICD-10-CM | POA: Diagnosis present

## 2017-01-22 MED ORDER — IBUPROFEN 400 MG PO TABS
600.0000 mg | ORAL_TABLET | Freq: Once | ORAL | Status: AC
  Administered 2017-01-22: 600 mg via ORAL
  Filled 2017-01-22: qty 1
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
## 2017-01-22 NOTE — Discharge Instructions (Signed)
Use ibuprofen and tylenol every 6 hrs as needed.  Use ice packs for pain and swelling.  Return for new injuries, numbness, tingling or new concerns.

## 2017-03-21 NOTE — L&D Delivery Note (Signed)
Delivery Note Pt pushed well for about an hour for delivery. At 2:49 AM a viable and healthy female was delivered via Vaginal, Spontaneous (Presentation: OA; LOT ).  APGAR: 8,9 ; weight  P.   Placenta status: delivered, intact .  Cord: 3V with the following complications: none.    Anesthesia:  epidural Episiotomy:  none Lacerations:  2nd degree perineal, R labial abrasion Suture Repair: 3.0 vicryl rapide Est. Blood Loss (mL):  150cc  Mom to postpartum.  Baby to Couplet care / Skin to Skin.  Elex Mainwaring Bovard-Stuckert 11/08/2017, 3:16 AM  Br/RI/B+/Contra[facility]

## 2017-04-15 ENCOUNTER — Encounter (HOSPITAL_COMMUNITY): Payer: Self-pay | Admitting: *Deleted

## 2017-04-15 ENCOUNTER — Inpatient Hospital Stay (HOSPITAL_COMMUNITY)
Admission: AD | Admit: 2017-04-15 | Discharge: 2017-04-15 | Disposition: A | Discharge status: Home / Self Care | Payer: Medicaid Other | Source: Ambulatory Visit | Attending: Obstetrics & Gynecology | Admitting: Obstetrics & Gynecology

## 2017-04-15 ENCOUNTER — Inpatient Hospital Stay (HOSPITAL_COMMUNITY): Payer: Medicaid Other

## 2017-04-15 DIAGNOSIS — O210 Mild hyperemesis gravidarum: Secondary | ICD-10-CM | POA: Insufficient documentation

## 2017-04-15 DIAGNOSIS — R109 Unspecified abdominal pain: Secondary | ICD-10-CM

## 2017-04-15 DIAGNOSIS — O26891 Other specified pregnancy related conditions, first trimester: Secondary | ICD-10-CM

## 2017-04-15 DIAGNOSIS — Z3A09 9 weeks gestation of pregnancy: Secondary | ICD-10-CM | POA: Insufficient documentation

## 2017-04-15 DIAGNOSIS — E860 Dehydration: Secondary | ICD-10-CM

## 2017-04-15 DIAGNOSIS — O219 Vomiting of pregnancy, unspecified: Secondary | ICD-10-CM

## 2017-04-15 DIAGNOSIS — O209 Hemorrhage in early pregnancy, unspecified: Secondary | ICD-10-CM

## 2017-04-15 LAB — POCT PREGNANCY, URINE: Preg Test, Ur: POSITIVE — AB

## 2017-04-15 LAB — URINALYSIS, ROUTINE W REFLEX MICROSCOPIC
BILIRUBIN URINE: NEGATIVE
Glucose, UA: NEGATIVE mg/dL
Hgb urine dipstick: NEGATIVE
Ketones, ur: 80 mg/dL — AB
Nitrite: NEGATIVE
PH: 5 (ref 5.0–8.0)
Protein, ur: NEGATIVE mg/dL
SPECIFIC GRAVITY, URINE: 1.03 (ref 1.005–1.030)

## 2017-04-15 MED ORDER — ONDANSETRON 4 MG PO TBDP: 4.0000 mg | ORAL_TABLET | Freq: Three times a day (TID) | ORAL | 1 refills | Status: DC | PRN

## 2017-04-15 MED ORDER — LACTATED RINGERS IV BOLUS (SEPSIS)
1000.0000 mL | Freq: Once | INTRAVENOUS | Status: AC
  Administered 2017-04-15: 1000 mL via INTRAVENOUS

## 2017-04-15 MED ORDER — ONDANSETRON 8 MG PO TBDP
8.0000 mg | ORAL_TABLET | Freq: Once | ORAL | Status: AC
  Administered 2017-04-15: 8 mg via ORAL
  Filled 2017-04-15: qty 1

## 2017-04-15 NOTE — Discharge Instructions (Signed)

## 2017-04-15 NOTE — MAU Note (Signed)
Pt stated she started  Having n/v last night with hot and cold chills. Feeling very dizzy and light headed. Pt stated she also noticed some spotting nad c/o abd cramping.

## 2017-04-15 NOTE — MAU Provider Note (Signed)
History   G2P2002 @ apx 9 wks by LMP in with nausea nad vomiting since yesterday. Also states some abd pain and spotting today.  CSN: [id number]  Arrival date & time 04/15/17  1518   None     Chief Complaint  Patient presents with  . Emesis  . Nausea    HPI  Past Medical History:  Diagnosis Date  . Medical history non-contributory   . SVD (spontaneous vaginal delivery) 05/04/2012    No past surgical history on file.  No family history on file.  Social History   Tobacco Use  . Smoking status: Never Smoker  . Smokeless tobacco: Never Used  Substance Use Topics  . Alcohol use: No  . Drug use: No    OB History    Gravida Para Term Preterm AB Living   3 2 2  0 0 2   SAB TAB Ectopic Multiple Live Births   0 0 0 0 1      Review of Systems  Constitutional: Negative.   HENT: Negative.   Eyes: Negative.   Respiratory: Negative.   Gastrointestinal: Positive for abdominal pain, nausea and vomiting.  Endocrine: Negative.   Genitourinary: Positive for vaginal bleeding.  Musculoskeletal: Negative.   Skin: Negative.   Allergic/Immunologic: Negative.   Neurological: Negative.   Hematological: Negative.   Psychiatric/Behavioral: Negative.     Allergies  Patient has no known allergies.  Home Medications    Ht 5\' 2"  (1.575 m)   Wt 147 lb (66.7 kg)   BMI 26.89 kg/m   Physical Exam  Constitutional: She is oriented to person, place, and time. She appears well-developed and well-nourished.  HENT:  Head: Normocephalic.  Eyes: Pupils are equal, round, and reactive to light.  Neck: Normal range of motion.  Cardiovascular: Normal rate, normal heart sounds and intact distal pulses.  Pulmonary/Chest: Effort normal and breath sounds normal.  Abdominal: Soft. Bowel sounds are normal.  Genitourinary: Vagina normal and uterus normal.  Musculoskeletal: Normal range of motion.  Neurological: She is alert and oriented to person, place, and time. She has normal reflexes.   Skin: Skin is warm and dry.  Psychiatric: She has a normal mood and affect. Her behavior is normal. Judgment and thought content normal.    MAU Course  Procedures (including critical care time)  Labs Reviewed  POCT PREGNANCY, URINE - Abnormal; Notable for the following components:      Result Value   Preg Test, Ur POSITIVE (*)    All other components within normal limits  URINALYSIS, ROUTINE W REFLEX MICROSCOPIC   No results found. Results for orders placed or performed during the hospital encounter of 04/15/17 (from the past 24 hour(s))  Urinalysis, Routine w reflex microscopic     Status: Abnormal   Collection Time: 04/15/17  3:30 PM  Result Value Ref Range   Color, Urine YELLOW YELLOW   APPearance HAZY (A) CLEAR   Specific Gravity, Urine 1.030 1.005 - 1.030   pH 5.0 5.0 - 8.0   Glucose, UA NEGATIVE NEGATIVE mg/dL   Hgb urine dipstick NEGATIVE NEGATIVE   Bilirubin Urine NEGATIVE NEGATIVE   Ketones, ur 80 (A) NEGATIVE mg/dL   Protein, ur NEGATIVE NEGATIVE mg/dL   Nitrite NEGATIVE NEGATIVE   Leukocytes, UA SMALL (A) NEGATIVE   RBC / HPF 0-5 0 - 5 RBC/hpf   WBC, UA 0-5 0 - 5 WBC/hpf   Bacteria, UA FEW (A) NONE SEEN   Squamous Epithelial / LPF 0-5 (A) NONE SEEN  Mucus PRESENT   Pregnancy, urine POC     Status: Abnormal   Collection Time: 04/15/17  3:39 PM  Result Value Ref Range   Preg Test, Ur POSITIVE (A) NEGATIVE    1. Morning sickness       MDM  VSS, urine 80 ketones will hydrate and give zofran to slowly introduce clear fluids. If tolerates will d/c home. U/s shows viable IUP at 8.[redacted] wks gestation. Retaining po fluids and requesting d/c home.

## 2017-05-07 ENCOUNTER — Inpatient Hospital Stay (HOSPITAL_COMMUNITY)
Admission: AD | Admit: 2017-05-07 | Discharge: 2017-05-07 | Disposition: A | Discharge status: Home / Self Care | Payer: Medicaid Other | Source: Ambulatory Visit | Attending: Obstetrics and Gynecology | Admitting: Obstetrics and Gynecology

## 2017-05-07 ENCOUNTER — Encounter (HOSPITAL_COMMUNITY): Payer: Self-pay

## 2017-05-07 DIAGNOSIS — R109 Unspecified abdominal pain: Secondary | ICD-10-CM

## 2017-05-07 DIAGNOSIS — O26899 Other specified pregnancy related conditions, unspecified trimester: Secondary | ICD-10-CM

## 2017-05-07 DIAGNOSIS — O26891 Other specified pregnancy related conditions, first trimester: Secondary | ICD-10-CM

## 2017-05-07 DIAGNOSIS — Z3491 Encounter for supervision of normal pregnancy, unspecified, first trimester: Secondary | ICD-10-CM

## 2017-05-07 DIAGNOSIS — Z3A11 11 weeks gestation of pregnancy: Secondary | ICD-10-CM | POA: Insufficient documentation

## 2017-05-07 DIAGNOSIS — O209 Hemorrhage in early pregnancy, unspecified: Secondary | ICD-10-CM

## 2017-05-07 LAB — URINALYSIS, ROUTINE W REFLEX MICROSCOPIC
Bilirubin Urine: NEGATIVE
GLUCOSE, UA: NEGATIVE mg/dL
Hgb urine dipstick: NEGATIVE
Ketones, ur: 5 mg/dL — AB
Nitrite: NEGATIVE
PROTEIN: NEGATIVE mg/dL
Specific Gravity, Urine: 1.021 (ref 1.005–1.030)
pH: 5 (ref 5.0–8.0)

## 2017-05-07 LAB — WET PREP, GENITAL
CLUE CELLS WET PREP: NONE SEEN
Sperm: NONE SEEN
TRICH WET PREP: NONE SEEN
YEAST WET PREP: NONE SEEN
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
## 2017-05-07 NOTE — Discharge Instructions (Signed)
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
## 2017-05-08 LAB — GC/CHLAMYDIA PROBE AMP (~~LOC~~) NOT AT ARMC
Chlamydia: NEGATIVE
NEISSERIA GONORRHEA: NEGATIVE

## 2017-05-09 LAB — CULTURE, OB URINE: CULTURE: NO GROWTH

## 2017-05-26 LAB — OB RESULTS CONSOLE RPR: RPR: NONREACTIVE

## 2017-05-26 LAB — OB RESULTS CONSOLE RUBELLA ANTIBODY, IGM: RUBELLA: IMMUNE

## 2017-05-26 LAB — OB RESULTS CONSOLE HEPATITIS B SURFACE ANTIGEN: Hepatitis B Surface Ag: NEGATIVE

## 2017-05-26 LAB — OB RESULTS CONSOLE HIV ANTIBODY (ROUTINE TESTING): HIV: NONREACTIVE

## 2017-05-26 LAB — OB RESULTS CONSOLE GC/CHLAMYDIA
Chlamydia: NEGATIVE
Gonorrhea: NEGATIVE

## 2017-05-26 LAB — OB RESULTS CONSOLE ABO/RH: RH Type: POSITIVE

## 2017-05-26 LAB — OB RESULTS CONSOLE ANTIBODY SCREEN: ANTIBODY SCREEN: NEGATIVE

## 2017-07-31 ENCOUNTER — Encounter (HOSPITAL_COMMUNITY): Payer: Self-pay | Admitting: Student

## 2017-07-31 ENCOUNTER — Inpatient Hospital Stay (HOSPITAL_COMMUNITY)
Admission: AD | Admit: 2017-07-31 | Discharge: 2017-07-31 | Disposition: A | Discharge status: Home / Self Care | Payer: Medicaid Other | Source: Ambulatory Visit | Attending: Obstetrics and Gynecology | Admitting: Obstetrics and Gynecology

## 2017-07-31 DIAGNOSIS — O98512 Other viral diseases complicating pregnancy, second trimester: Secondary | ICD-10-CM | POA: Diagnosis not present

## 2017-07-31 DIAGNOSIS — J029 Acute pharyngitis, unspecified: Secondary | ICD-10-CM

## 2017-07-31 DIAGNOSIS — Z3A24 24 weeks gestation of pregnancy: Secondary | ICD-10-CM

## 2017-07-31 DIAGNOSIS — R05 Cough: Secondary | ICD-10-CM | POA: Diagnosis not present

## 2017-07-31 DIAGNOSIS — B001 Herpesviral vesicular dermatitis: Secondary | ICD-10-CM | POA: Diagnosis not present

## 2017-07-31 DIAGNOSIS — R109 Unspecified abdominal pain: Secondary | ICD-10-CM | POA: Diagnosis not present

## 2017-07-31 DIAGNOSIS — R058 Other specified cough: Secondary | ICD-10-CM

## 2017-07-31 DIAGNOSIS — O26892 Other specified pregnancy related conditions, second trimester: Secondary | ICD-10-CM

## 2017-07-31 LAB — URINALYSIS, ROUTINE W REFLEX MICROSCOPIC
BILIRUBIN URINE: NEGATIVE
GLUCOSE, UA: NEGATIVE mg/dL
HGB URINE DIPSTICK: NEGATIVE
Ketones, ur: NEGATIVE mg/dL
NITRITE: NEGATIVE
Protein, ur: NEGATIVE mg/dL
Specific Gravity, Urine: 1.021 (ref 1.005–1.030)
pH: 6 (ref 5.0–8.0)

## 2017-07-31 LAB — GROUP A STREP BY PCR: Group A Strep by PCR: NOT DETECTED

## 2017-07-31 MED ORDER — VALACYCLOVIR HCL 1 G PO TABS: 1000.0000 mg | ORAL_TABLET | Freq: Two times a day (BID) | ORAL | 0 refills | Status: DC

## 2017-07-31 NOTE — Discharge Instructions (Signed)

## 2017-07-31 NOTE — MAU Note (Signed)
Pt presents to MAU with complaints of a cough for a week and noticed blisters on her mouth today. Reports abdominal pain from coughing

## 2017-07-31 NOTE — MAU Provider Note (Signed)
History     CSN: [id number]  Arrival date and time: 07/31/17 0901   First Provider Initiated Contact with Patient 07/31/17 0930      Chief Complaint  Patient presents with  . Abdominal Pain  . Mouth Lesions  . Sore Throat  . Cough   HPI  [name] is a 27 y.o. [id number] at [redacted]w[redacted]d who presents with multiple complaints.  Cough & sore throat x 1 week. States she thought it was due to allergies. Reports cough is productive of yellow/clear mucous and is worse at night. Has not treated symptoms. Also reports sore throat that has worsened in the last few days. Rates pain 7/10. Denies fever/chills, wheezing, SOB, or ear pain. Last night had lower abdominal pain after coughing a lot. Last time she felt pain was around 5am this morning. Currently no pain. Denies LOF or vaginal bleeding.  Last night noticed a sore on her lower lip. States it's a blister and that it's slightly swollen. Reports tingling and burning. Her significant other gets cold sores during the winter time but has not had one recently. She denies hx of cold sores herself.   OB History    Gravida  4   Para  2   Term  2   Preterm  0   AB  1   Living  2     SAB  1   TAB  0   Ectopic  0   Multiple  0   Live Births  2           Past Medical History:  Diagnosis Date  . SVD (spontaneous vaginal delivery) 05/04/2012    Past Surgical History:  Procedure Laterality Date  . NO PAST SURGERIES      History reviewed. No pertinent family history.  Social History   Tobacco Use  . Smoking status: Never Smoker  . Smokeless tobacco: Never Used  Substance Use Topics  . Alcohol use: No  . Drug use: No    Allergies: No Known Allergies  Medications Prior to Admission  Medication Sig Dispense Refill Last Dose  . ondansetron (ZOFRAN ODT) 4 MG disintegrating tablet Take 1 tablet (4 mg total) by mouth every 8 (eight) hours as needed for nausea or vomiting. 40 tablet 1 Past Month at Unknown time  . Prenatal  Vit-Fe Fumarate-FA (PRENATAL MULTIVITAMIN) TABS tablet Take 1 tablet by mouth daily at 12 noon.   05/06/2017 at Unknown time    Review of Systems  Constitutional: Negative.   HENT: Positive for mouth sores and sore throat. Negative for ear pain.   Respiratory: Positive for cough. Negative for shortness of breath and wheezing.   Cardiovascular: Negative for chest pain.  Gastrointestinal: Positive for abdominal pain (not currently). Negative for nausea and vomiting.  Genitourinary: Negative.    Physical Exam   Blood pressure 127/67, pulse (!) 102, temperature 98.8 F (37.1 C), resp. rate 16, height  (1.575 m), weight 164 lb (74.4 kg), last menstrual period 02/14/2017, unknown if currently breastfeeding.  Physical Exam  Nursing note and vitals reviewed. Constitutional: She is oriented to person, place, and time. She appears well-developed and well-nourished. No distress.  HENT:  Head: Normocephalic and atraumatic.  Mouth/Throat: Oral lesions present. Posterior oropharyngeal erythema present. No oropharyngeal exudate or posterior oropharyngeal edema.  Lesion on mid lower lip. 2 small vesicles with erythematous base.   Eyes: Conjunctivae are normal. Right eye exhibits no discharge. Left eye exhibits no discharge. No scleral icterus.  Neck: Normal  range of motion.  Cardiovascular: Normal rate, regular rhythm and normal heart sounds.  No murmur heard. Respiratory: Effort normal and breath sounds normal. No respiratory distress. She has no wheezes.  GI: Soft. There is no tenderness.  Genitourinary:  Genitourinary Comments: Dilation: Closed Exam by:: Estanislado Spire NP   Neurological: She is alert and oriented to person, place, and time.  Skin: Skin is warm and dry. She is not diaphoretic.  Psychiatric: She has a normal mood and affect. Her behavior is normal. Judgment and thought content normal.    MAU Course  Procedures Results for orders placed or performed during the hospital  encounter of 07/31/17 (from the past 24 hour(s))  Urinalysis, Routine w reflex microscopic     Status: Abnormal   Collection Time: 07/31/17  9:25 AM  Result Value Ref Range   Color, Urine YELLOW YELLOW   APPearance HAZY (A) CLEAR   Specific Gravity, Urine 1.021 1.005 - 1.030   pH 6.0 5.0 - 8.0   Glucose, UA NEGATIVE NEGATIVE mg/dL   Hgb urine dipstick NEGATIVE NEGATIVE   Bilirubin Urine NEGATIVE NEGATIVE   Ketones, ur NEGATIVE NEGATIVE mg/dL   Protein, ur NEGATIVE NEGATIVE mg/dL   Nitrite NEGATIVE NEGATIVE   Leukocytes, UA SMALL (A) NEGATIVE   RBC / HPF 0-5 0 - 5 RBC/hpf   WBC, UA 0-5 0 - 5 WBC/hpf   Bacteria, UA MANY (A) NONE SEEN   Squamous Epithelial / LPF 11-20 0 - 5   Mucus PRESENT   Group A Strep by PCR     Status: None   Collection Time: 07/31/17 10:00 AM  Result Value Ref Range   Group A Strep by PCR NOT DETECTED NOT DETECTED    MDM NST:  Baseline: 135 bpm, Variability: Good {> 6 bpm), Accelerations: Non-reactive but appropriate for gestational age and Decelerations: Absent Fetal tracing appropriate for gestation No ctx on monitor and cervix closed/thick  Strep swab collected  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 Ellyn Hack. Notified of presentation & assessment. Ok to discharge home. Will rx valtrex.   Assessment and Plan  A: 1. Cold sore   2. [redacted] weeks gestation of pregnancy   3. Allergic cough   4. Acute pharyngitis, unspecified etiology    P; Discharge home Rx valtrex Discussed reasons to return to MAU Keep f/u with OB OTC meds list given to patient   Judeth Horn 07/31/2017, 9:30 AM

## 2017-10-19 LAB — OB RESULTS CONSOLE GBS: GBS: NEGATIVE

## 2017-10-23 ENCOUNTER — Inpatient Hospital Stay (HOSPITAL_COMMUNITY)
Admission: AD | Admit: 2017-10-23 | Discharge: 2017-10-23 | Disposition: A | Discharge status: Home / Self Care | Payer: Medicaid Other | Source: Ambulatory Visit | Attending: Obstetrics and Gynecology | Admitting: Obstetrics and Gynecology

## 2017-10-23 ENCOUNTER — Encounter (HOSPITAL_COMMUNITY): Payer: Self-pay | Admitting: *Deleted

## 2017-10-23 DIAGNOSIS — N898 Other specified noninflammatory disorders of vagina: Secondary | ICD-10-CM

## 2017-10-23 DIAGNOSIS — O9989 Other specified diseases and conditions complicating pregnancy, childbirth and the puerperium: Secondary | ICD-10-CM | POA: Diagnosis not present

## 2017-10-23 DIAGNOSIS — Z3A36 36 weeks gestation of pregnancy: Secondary | ICD-10-CM | POA: Diagnosis not present

## 2017-10-23 DIAGNOSIS — Z0371 Encounter for suspected problem with amniotic cavity and membrane ruled out: Secondary | ICD-10-CM | POA: Insufficient documentation

## 2017-10-23 HISTORY — DX: Anemia, unspecified: D64.9

## 2017-10-23 LAB — CBC
HEMATOCRIT: 31.4 % — AB (ref 36.0–46.0)
HEMOGLOBIN: 10.5 g/dL — AB (ref 12.0–15.0)
MCH: 28.8 pg (ref 26.0–34.0)
MCHC: 33.4 g/dL (ref 30.0–36.0)
MCV: 86 fL (ref 78.0–100.0)
Platelets: 254 10*3/uL (ref 150–400)
RBC: 3.65 MIL/uL — AB (ref 3.87–5.11)
RDW: 13.4 % (ref 11.5–15.5)
WBC: 11.7 10*3/uL — ABNORMAL HIGH (ref 4.0–10.5)

## 2017-10-23 LAB — COMPREHENSIVE METABOLIC PANEL
ALT: 13 U/L (ref 0–44)
ANION GAP: 11 (ref 5–15)
AST: 17 U/L (ref 15–41)
Albumin: 2.9 g/dL — ABNORMAL LOW (ref 3.5–5.0)
Alkaline Phosphatase: 139 U/L — ABNORMAL HIGH (ref 38–126)
BILIRUBIN TOTAL: 0.6 mg/dL (ref 0.3–1.2)
BUN: 7 mg/dL (ref 6–20)
CALCIUM: 9 mg/dL (ref 8.9–10.3)
CO2: 19 mmol/L — ABNORMAL LOW (ref 22–32)
CREATININE: 0.41 mg/dL — AB (ref 0.44–1.00)
Chloride: 106 mmol/L (ref 98–111)
GFR calc non Af Amer: >60 mL/min (ref >60)
Glucose, Bld: 92 mg/dL (ref 70–99)
Potassium: 3.8 mmol/L (ref 3.5–5.1)
Sodium: 136 mmol/L (ref 135–145)
TOTAL PROTEIN: 6.4 g/dL — AB (ref 6.5–8.1)

## 2017-10-23 LAB — AMNISURE RUPTURE OF MEMBRANE (ROM) NOT AT ARMC: Amnisure ROM: NEGATIVE

## 2017-10-23 LAB — POCT FERN TEST: POCT Fern Test: NEGATIVE

## 2017-10-23 LAB — PROTEIN / CREATININE RATIO, URINE
Creatinine, Urine: 81 mg/dL
PROTEIN CREATININE RATIO: 0.1 mg/mg{creat} (ref 0.00–0.15)
TOTAL PROTEIN, URINE: 8 mg/dL

## 2017-10-23 NOTE — MAU Provider Note (Signed)
History     CSN: [id number]  Arrival date and time: 10/23/17 1633   First Provider Initiated Contact with Patient 10/23/17 1811      Chief Complaint  Patient presents with  . Rupture of Membranes   HPI  [name] is a 27 y.o. year old [id number] female at 4859w6d weeks gestation who presents to MAU reporting a gush of fluid that "ran down her legs" around 1530, but she is unsure if she is still leaking fluid now. She also complains that she "feels like she has PEC."    Past Medical History:  Diagnosis Date  . Anemia   . SVD (spontaneous vaginal delivery) 05/04/2012    Past Surgical History:  Procedure Laterality Date  . NO PAST SURGERIES      History reviewed. No pertinent family history.  Social History   Tobacco Use  . Smoking status: Never Smoker  . Smokeless tobacco: Never Used  Substance Use Topics  . Alcohol use: No  . Drug use: No    Allergies: No Known Allergies  Medications Prior to Admission  Medication Sig Dispense Refill Last Dose  . ondansetron (ZOFRAN ODT) 4 MG disintegrating tablet Take 1 tablet (4 mg total) by mouth every 8 (eight) hours as needed for nausea or vomiting. 40 tablet 1 Past Month at Unknown time  . Prenatal Vit-Fe Fumarate-FA (PRENATAL MULTIVITAMIN) TABS tablet Take 1 tablet by mouth daily at 12 noon.   07/30/2017 at Unknown time  . Throat Lozenges (COUGH DROPS MT) Use as directed 1 Dose in the mouth or throat as needed.   Past Week at Unknown time  . valACYclovir (VALTREX) 1000 MG tablet Take 1 tablet (1,000 mg total) by mouth 2 (two) times daily. 14 tablet 0     Review of Systems  Constitutional: Negative.   HENT: Negative.   Eyes: Positive for visual disturbance (blurry vision).  Respiratory: Negative.   Cardiovascular: Negative.   Gastrointestinal: Negative.   Endocrine: Negative.   Genitourinary: Negative.   Musculoskeletal: Negative.   Skin: Negative.   Allergic/Immunologic: Negative.   Neurological: Positive for  dizziness.  Hematological: Negative.   Psychiatric/Behavioral: Negative.    Physical Exam   Patient Vitals for the past 24 hrs:  BP Temp Temp src Pulse Resp SpO2 Height Weight  10/23/17 1816 121/77 - - (!) 101 - - - -  10/23/17 1801 102/60 - - 100 - - - -  10/23/17 1746 120/68 - - 100 - - - -  10/23/17 1731 121/70 - - (!) 109 - - - -  10/23/17 1718 122/77 - - (!) 110 - - - -  10/23/17 1702 (!) 144/75 98.8 F (37.1 C) Oral (!) 107 17 100 % 5\' 2"  (1.575 m) 180 lb (81.6 kg)    Physical Exam  Nursing note and vitals reviewed. Constitutional: She is oriented to person, place, and time. She appears well-developed and well-nourished.  HENT:  Head: Normocephalic and atraumatic.  Eyes: Pupils are equal, round, and reactive to light.  Neck: Normal range of motion.  Cardiovascular: Normal rate.  Respiratory: Effort normal.  GI: Soft.  Musculoskeletal: Normal range of motion.  Neurological: She is alert and oriented to person, place, and time.    MAU Course  Procedures  MDM CBC CMP P/C Ratio  Serial BPs Fern Slide  Amnisure  *Consult with Dr. Jackelyn KnifeMeisinger @ 2020 - notified of patient's complaints, assessments, lab & NST results, tx plan d/c home, keep scheduled appt - ok to d/c  home, agrees with plan  Results for orders placed or performed during the hospital encounter of 10/23/17 (from the past 24 hour(s))  Protein / creatinine ratio, urine     Status: None   Collection Time: 10/23/17  5:21 PM  Result Value Ref Range   Creatinine, Urine 81.00 mg/dL   Total Protein, Urine 8 mg/dL   Protein Creatinine Ratio 0.10 0.00 - 0.15 mg/mg[Cre]  Fern Test     Status: None   Collection Time: 10/23/17  5:30 PM  Result Value Ref Range   POCT Fern Test Negative = intact amniotic membranes   Amnisure rupture of membrane (rom)not at [facility]     Status: None   Collection Time: 10/23/17  5:44 PM  Result Value Ref Range   Amnisure ROM NEGATIVE   CBC     Status: Abnormal   Collection Time:  10/23/17  6:35 PM  Result Value Ref Range   WBC 11.7 (H) 4.0 - 10.5 K/uL   RBC 3.65 (L) 3.87 - 5.11 MIL/uL   Hemoglobin 10.5 (L) 12.0 - 15.0 g/dL   HCT 16.1 (L) 09.6 - 04.5 %   MCV 86.0 78.0 - 100.0 fL   MCH 28.8 26.0 - 34.0 pg   MCHC 33.4 30.0 - 36.0 g/dL   RDW 40.9 81.1 - 91.4 %   Platelets 254 150 - 400 K/uL  Comprehensive metabolic panel     Status: Abnormal   Collection Time: 10/23/17  6:35 PM  Result Value Ref Range   Sodium 136 135 - 145 mmol/L   Potassium 3.8 3.5 - 5.1 mmol/L   Chloride 106 98 - 111 mmol/L   CO2 19 (L) 22 - 32 mmol/L   Glucose, Bld 92 70 - 99 mg/dL   BUN 7 6 - 20 mg/dL   Creatinine, Ser 7.82 (L) 0.44 - 1.00 mg/dL   Calcium 9.0 8.9 - 95.6 mg/dL   Total Protein 6.4 (L) 6.5 - 8.1 g/dL   Albumin 2.9 (L) 3.5 - 5.0 g/dL   AST 17 15 - 41 U/L   ALT 13 0 - 44 U/L   Alkaline Phosphatase 139 (H) 38 - 126 U/L   Total Bilirubin 0.6 0.3 - 1.2 mg/dL   GFR calc non Af Amer >60 >60 mL/min   GFR calc Af Amer >60 >60 mL/min   Anion gap 11 5 - 15   Assessment and Plan  No leakage of amniotic fluid into vagina  - Reassurance given that the fluid she leaked today was not amniotic fluid - Information provided on [facility] ctxs - Keep scheduled appt with [facility]  - Discharge patient - Patient verbalized an understanding of the plan of care and agrees.    Raelyn Mora, MSN, CNM 10/23/2017, 6:12 PM

## 2017-10-23 NOTE — MAU Note (Signed)
Urine in lab 

## 2017-10-23 NOTE — MAU Note (Signed)
Pt reports she is having a lot of back pain and had a gush of fluid at 1530, unsure if she is still leaking.

## 2017-11-05 ENCOUNTER — Inpatient Hospital Stay (HOSPITAL_COMMUNITY)
Admission: AD | Admit: 2017-11-05 | Discharge: 2017-11-05 | Disposition: A | Discharge status: Home / Self Care | Payer: Medicaid Other | Source: Ambulatory Visit | Attending: Obstetrics and Gynecology | Admitting: Obstetrics and Gynecology

## 2017-11-05 ENCOUNTER — Encounter (HOSPITAL_COMMUNITY): Payer: Self-pay | Admitting: *Deleted

## 2017-11-05 DIAGNOSIS — O471 False labor at or after 37 completed weeks of gestation: Secondary | ICD-10-CM | POA: Diagnosis present

## 2017-11-05 DIAGNOSIS — Z3A39 39 weeks gestation of pregnancy: Secondary | ICD-10-CM | POA: Diagnosis not present

## 2017-11-05 NOTE — Discharge Instructions (Signed)
Braxton Hicks Contractions °Contractions of the uterus can occur throughout pregnancy, but they are not always a sign that you are in labor. You may have practice contractions called Braxton Hicks contractions. These false labor contractions are sometimes confused with true labor. °What are Braxton Hicks contractions? °Braxton Hicks contractions are tightening movements that occur in the muscles of the uterus before labor. Unlike true labor contractions, these contractions do not result in opening (dilation) and thinning of the cervix. Toward the end of pregnancy (32-34 weeks), Braxton Hicks contractions can happen more often and may become stronger. These contractions are sometimes difficult to tell apart from true labor because they can be very uncomfortable. You should not feel embarrassed if you go to the hospital with false labor. °Sometimes, the only way to tell if you are in true labor is for your health care provider to look for changes in the cervix. The health care provider will do a physical exam and may monitor your contractions. If you are not in true labor, the exam should show that your cervix is not dilating and your water has not broken. °If there are other health problems associated with your pregnancy, it is completely safe for you to be sent home with false labor. You may continue to have Braxton Hicks contractions until you go into true labor. °How to tell the difference between true labor and false labor °True labor °· Contractions last 30-70 seconds. °· Contractions become very regular. °· Discomfort is usually felt in the top of the uterus, and it spreads to the lower abdomen and low back. °· Contractions do not go away with walking. °· Contractions usually become more intense and increase in frequency. °· The cervix dilates and gets thinner. °False labor °· Contractions are usually shorter and not as strong as true labor contractions. °· Contractions are usually irregular. °· Contractions  are often felt in the front of the lower abdomen and in the groin. °· Contractions may go away when you walk around or change positions while lying down. °· Contractions get weaker and are shorter-lasting as time goes on. °· The cervix usually does not dilate or become thin. °Follow these instructions at home: °· Take over-the-counter and prescription medicines only as told by your health care provider. °· Keep up with your usual exercises and follow other instructions from your health care provider. °· Eat and drink lightly if you think you are going into labor. °· If Braxton Hicks contractions are making you uncomfortable: °? Change your position from lying down or resting to walking, or change from walking to resting. °? Sit and rest in a tub of warm water. °? Drink enough fluid to keep your urine pale yellow. Dehydration may cause these contractions. °? Do slow and deep breathing several times an hour. °· Keep all follow-up prenatal visits as told by your health care provider. This is important. °Contact a health care provider if: °· You have a fever. °· You have continuous pain in your abdomen. °Get help right away if: °· Your contractions become stronger, more regular, and closer together. °· You have fluid leaking or gushing from your vagina. °· You pass blood-tinged mucus (bloody show). °· You have bleeding from your vagina. °· You have low back pain that you never had before. °· You feel your baby’s head pushing down and causing pelvic pressure. °· Your baby is not moving inside you as much as it used to. °Summary °· Contractions that occur before labor are called Braxton   Hicks contractions, false labor, or practice contractions. °· Braxton Hicks contractions are usually shorter, weaker, farther apart, and less regular than true labor contractions. True labor contractions usually become progressively stronger and regular and they become more frequent. °· Manage discomfort from Braxton Hicks contractions by  changing position, resting in a warm bath, drinking plenty of water, or practicing deep breathing. °This information is not intended to replace advice given to you by your health care provider. Make sure you discuss any questions you have with your health care provider. °Document Released: 07/21/2016 Document Revised: 07/21/2016 Document Reviewed: 07/21/2016 °Elsevier Interactive Patient Education © 2018 [employer] ° °

## 2017-11-05 NOTE — MAU Note (Signed)
I have communicated with Dr. Jackelyn KnifeMeisinger and reviewed vital signs:  Vitals:   11/05/17 1520 11/05/17 1642  BP: 130/71 125/67  Pulse: (!) 106 (!) 102  Resp: 17 17  Temp: 98.1 F (36.7 C)   SpO2: 100% 100%    Vaginal exam:  Dilation: 4 Effacement (%): 60 Cervical Position: Posterior Presentation: Vertex Exam by:: [place] Bells. Neil CNM,   Also reviewed contraction pattern and that non-stress test is reactive.  It has been documented that patient is contracting occassionally with no cervical change since last OB check not indicating active labor.  Patient denies any other complaints.  Based on this report provider has given order for discharge.  A discharge order and diagnosis entered by a provider.   Labor discharge instructions reviewed with patient.

## 2017-11-07 ENCOUNTER — Inpatient Hospital Stay (HOSPITAL_COMMUNITY): Payer: Medicaid Other | Admitting: Anesthesiology

## 2017-11-07 ENCOUNTER — Encounter (HOSPITAL_COMMUNITY): Payer: Self-pay

## 2017-11-07 ENCOUNTER — Inpatient Hospital Stay (HOSPITAL_COMMUNITY)
Admission: AD | Admit: 2017-11-07 | Discharge: 2017-11-09 | DRG: 807 | Disposition: A | Discharge status: Home / Self Care | Payer: Medicaid Other | Attending: Obstetrics and Gynecology | Admitting: Obstetrics and Gynecology

## 2017-11-07 ENCOUNTER — Other Ambulatory Visit: Payer: Self-pay

## 2017-11-07 DIAGNOSIS — Z3A39 39 weeks gestation of pregnancy: Secondary | ICD-10-CM | POA: Diagnosis not present

## 2017-11-07 DIAGNOSIS — Z0371 Encounter for suspected problem with amniotic cavity and membrane ruled out: Secondary | ICD-10-CM

## 2017-11-07 DIAGNOSIS — Z3483 Encounter for supervision of other normal pregnancy, third trimester: Secondary | ICD-10-CM

## 2017-11-07 DIAGNOSIS — Z3493 Encounter for supervision of normal pregnancy, unspecified, third trimester: Secondary | ICD-10-CM

## 2017-11-07 HISTORY — DX: Encounter for supervision of normal pregnancy, unspecified, third trimester: Z34.93

## 2017-11-07 LAB — CBC
HEMATOCRIT: 30.7 % — AB (ref 36.0–46.0)
HEMOGLOBIN: 10.2 g/dL — AB (ref 12.0–15.0)
MCH: 27.6 pg (ref 26.0–34.0)
MCHC: 33.2 g/dL (ref 30.0–36.0)
MCV: 83.2 fL (ref 78.0–100.0)
Platelets: 277 10*3/uL (ref 150–400)
RBC: 3.69 MIL/uL — ABNORMAL LOW (ref 3.87–5.11)
RDW: 13.6 % (ref 11.5–15.5)
WBC: 10 10*3/uL (ref 4.0–10.5)

## 2017-11-07 LAB — TYPE AND SCREEN
ABO/RH(D): B POS
ANTIBODY SCREEN: NEGATIVE

## 2017-11-07 LAB — ABO/RH: ABO/RH(D): B POS

## 2017-11-07 MED ORDER — SOD CITRATE-CITRIC ACID 500-334 MG/5ML PO SOLN: 30.0000 mL | ORAL | Status: DC | PRN

## 2017-11-07 MED ORDER — LACTATED RINGERS IV SOLN
INTRAVENOUS | Status: DC
  Administered 2017-11-07 (×2): via INTRAVENOUS

## 2017-11-07 MED ORDER — OXYCODONE-ACETAMINOPHEN 5-325 MG PO TABS: 1.0000 | ORAL_TABLET | ORAL | Status: DC | PRN

## 2017-11-07 MED ORDER — EPHEDRINE 5 MG/ML INJ
10.0000 mg | INTRAVENOUS | Status: DC | PRN
  Filled 2017-11-07: qty 2

## 2017-11-07 MED ORDER — LIDOCAINE HCL (PF) 1 % IJ SOLN
30.0000 mL | INTRAMUSCULAR | Status: DC | PRN
  Filled 2017-11-07: qty 30

## 2017-11-07 MED ORDER — ONDANSETRON HCL 4 MG/2ML IJ SOLN
4.0000 mg | Freq: Four times a day (QID) | INTRAMUSCULAR | Status: DC | PRN
  Administered 2017-11-07: 4 mg via INTRAVENOUS
  Filled 2017-11-07: qty 2

## 2017-11-07 MED ORDER — DIPHENHYDRAMINE HCL 50 MG/ML IJ SOLN: 12.5000 mg | INTRAMUSCULAR | Status: DC | PRN

## 2017-11-07 MED ORDER — OXYTOCIN 40 UNITS IN LACTATED RINGERS INFUSION - SIMPLE MED
2.5000 [IU]/h | INTRAVENOUS | Status: DC
  Administered 2017-11-08: 2.5 [IU]/h via INTRAVENOUS

## 2017-11-07 MED ORDER — OXYTOCIN 40 UNITS IN LACTATED RINGERS INFUSION - SIMPLE MED
1.0000 m[IU]/min | INTRAVENOUS | Status: DC
  Administered 2017-11-07: 2 m[IU]/min via INTRAVENOUS
  Filled 2017-11-07: qty 1000

## 2017-11-07 MED ORDER — LACTATED RINGERS IV SOLN: 500.0000 mL | INTRAVENOUS | Status: DC | PRN

## 2017-11-07 MED ORDER — ACETAMINOPHEN 325 MG PO TABS: 650.0000 mg | ORAL_TABLET | ORAL | Status: DC | PRN

## 2017-11-07 MED ORDER — OXYCODONE-ACETAMINOPHEN 5-325 MG PO TABS: 2.0000 | ORAL_TABLET | ORAL | Status: DC | PRN

## 2017-11-07 MED ORDER — TERBUTALINE SULFATE 1 MG/ML IJ SOLN
0.2500 mg | Freq: Once | INTRAMUSCULAR | Status: DC | PRN
  Filled 2017-11-07: qty 1

## 2017-11-07 MED ORDER — LIDOCAINE HCL (PF) 1 % IJ SOLN
INTRAMUSCULAR | Status: DC | PRN
  Administered 2017-11-07: 13 mL via EPIDURAL

## 2017-11-07 MED ORDER — PHENYLEPHRINE 40 MCG/ML (10ML) SYRINGE FOR IV PUSH (FOR BLOOD PRESSURE SUPPORT)
80.0000 ug | PREFILLED_SYRINGE | INTRAVENOUS | Status: DC | PRN
  Filled 2017-11-07: qty 5
  Filled 2017-11-07: qty 10

## 2017-11-07 MED ORDER — FENTANYL 2.5 MCG/ML BUPIVACAINE 1/10 % EPIDURAL INFUSION (WH - ANES)
14.0000 mL/h | INTRAMUSCULAR | Status: DC | PRN
  Administered 2017-11-07: 14 mL/h via EPIDURAL
  Filled 2017-11-07: qty 100

## 2017-11-07 MED ORDER — LACTATED RINGERS IV SOLN
500.0000 mL | Freq: Once | INTRAVENOUS | Status: AC
  Administered 2017-11-07: 500 mL via INTRAVENOUS

## 2017-11-07 MED ORDER — OXYTOCIN BOLUS FROM INFUSION
500.0000 mL | Freq: Once | INTRAVENOUS | Status: AC
  Administered 2017-11-08: 500 mL via INTRAVENOUS

## 2017-11-07 MED ORDER — PHENYLEPHRINE 40 MCG/ML (10ML) SYRINGE FOR IV PUSH (FOR BLOOD PRESSURE SUPPORT)
80.0000 ug | PREFILLED_SYRINGE | INTRAVENOUS | Status: DC | PRN
  Filled 2017-11-07: qty 5

## 2017-11-07 MED ORDER — BUTORPHANOL TARTRATE 1 MG/ML IJ SOLN: 1.0000 mg | INTRAMUSCULAR | Status: DC | PRN

## 2017-11-07 NOTE — H&P (Signed)
[name] is a 27 y.o. female [phone] at 39wk for IOL given favorable cervix.  Relatively uncomplicated PNC.  gbbs neg   OB History    Gravida  4   Para  2   Term  2   Preterm  0   AB  1   Living  2     SAB  1   TAB  0   Ectopic  0   Multiple  0   Live Births  2         SVD x 2, female x 2, 7#1-7#15  No abn pap  No STD  Past Medical History:  Diagnosis Date  . Anemia   . SVD (spontaneous vaginal delivery) 05/04/2012   Past Surgical History:  Procedure Laterality Date  . NO PAST SURGERIES     Family History: thyroid dz, HTN, DM Social History:  reports that she has never smoked. She has never used smokeless tobacco. She reports that she does not drink alcohol or use drugs. daycare, married  Meds FA, PNV, Zantac All NKDA     Maternal Diabetes: No Genetic Screening: Normal Maternal Ultrasounds/Referrals: Normal Fetal Ultrasounds or other Referrals:  None Maternal Substance Abuse:  No Significant Maternal Medications:  None Significant Maternal Lab Results:  Lab values include: Group B Strep negative Other Comments:  None  Review of Systems  Constitutional: Negative.   HENT: Negative.   Eyes: Negative.   Respiratory: Negative.   Cardiovascular: Negative.   Gastrointestinal: Negative.   Genitourinary: Negative.   Musculoskeletal: Negative.   Skin: Negative.   Neurological: Negative.   Psychiatric/Behavioral: Negative.    Maternal Medical History:  Contractions: Frequency: irregular.    Fetal activity: Perceived fetal activity is normal.    Prenatal Complications - Diabetes: none.      Last menstrual period 02/14/2017, unknown if currently breastfeeding. Maternal Exam:  Uterine Assessment: Contraction frequency is irregular.   Abdomen: Patient reports no abdominal tenderness. Fundal height is appropriate for gestation.   Estimated fetal weight is 7-8#.   Fetal presentation: vertex  Introitus: Normal vulva. Normal vagina.     Physical Exam  Constitutional: She is oriented to person, place, and time. She appears well-developed and well-nourished.  HENT:  Head: Normocephalic and atraumatic.  Cardiovascular: Normal rate and regular rhythm.  Respiratory: Effort normal and breath sounds normal. No respiratory distress. She has no wheezes.  GI: Soft. Bowel sounds are normal. She exhibits no distension. There is no tenderness.  Musculoskeletal: Normal range of motion.  Neurological: She is alert and oriented to person, place, and time.  Skin: Skin is warm and dry.  Psychiatric: She has a normal mood and affect. Her behavior is normal.    Prenatal labs: ABO, Rh: B/Positive/-- (03/08 0000) Antibody: Negative (03/08 0000) Rubella: Immune (03/08 0000) RPR: Nonreactive (03/08 0000)  HBsAg: Negative (03/08 0000)  HIV: Non-reactive (03/08 0000)  GBS: Negative (08/01 0000)   Hgb 11.4/Plt 276/Ur Cx neg/Chl neg/GC neg/Varicella immune/ AFP WNL/glucola 123/ CF neg, SMA neg, Fragile X neg  Nl anat/female, post plac Assessment/Plan: 26yo [id number] at 39+ for IOL Epidural prn AROM and pitocin to induce Expect SVD  [name] 11/07/2017, 6:43 PM

## 2017-11-07 NOTE — Progress Notes (Signed)
Patient ID: [name], female   DOB: [birth year], 27 y.o.   MRN: [id number]   Pt comfortable with epidural  AFVSS gen NAD FHTs 120's, mod var, + accels, category 1 toco q irr  AROM for clear fluid, w/o diff/comp SVE 4.5/80/-2  Continue IOL, add pitocin - expect SVD

## 2017-11-07 NOTE — Anesthesia Preprocedure Evaluation (Signed)
Anesthesia Evaluation  Patient identified by MRN, date of birth, ID band Patient awake    Reviewed: Allergy & Precautions, H&P , Patient's Chart, lab work & pertinent test results  Airway Mallampati: III  TM Distance: >3 FB Neck ROM: full    Dental no notable dental hx. (+) Teeth Intact   Pulmonary neg pulmonary ROS,    Pulmonary exam normal breath sounds clear to auscultation       Cardiovascular negative cardio ROS   Rhythm:regular Rate:Normal     Neuro/Psych negative neurological ROS  negative psych ROS   GI/Hepatic negative GI ROS, Neg liver ROS,   Endo/Other  negative endocrine ROS  Renal/GU negative Renal ROS  negative genitourinary   Musculoskeletal   Abdominal   Peds  Hematology negative hematology ROS (+)   Anesthesia Other Findings   Reproductive/Obstetrics (+) Pregnancy                             Anesthesia Physical  Anesthesia Plan  ASA: II  Anesthesia Plan: Epidural   Post-op Pain Management:    Induction:   PONV Risk Score and Plan:   Airway Management Planned:   Additional Equipment:   Intra-op Plan:   Post-operative Plan:   Informed Consent: I have reviewed the patients History and Physical, chart, labs and discussed the procedure including the risks, benefits and alternatives for the proposed anesthesia with the patient or authorized representative who has indicated his/her understanding and acceptance.     Plan Discussed with: Anesthesiologist  Anesthesia Plan Comments:         Anesthesia Quick Evaluation

## 2017-11-07 NOTE — Anesthesia Procedure Notes (Signed)
Epidural Patient location during procedure: OB Start time: 11/07/2017 7:55 PM End time: 11/07/2017 8:07 PM  Staffing Anesthesiologist: Lowella CurbMiller, Warren Ray, MD Performed: anesthesiologist   Preanesthetic Checklist Completed: patient identified, site marked, surgical consent, pre-op evaluation, timeout performed, IV checked, risks and benefits discussed and monitors and equipment checked  Epidural Patient position: sitting Prep: ChloraPrep Patient monitoring: heart rate, cardiac monitor, continuous pulse ox and blood pressure Approach: midline Location: L2-L3 Injection technique: LOR saline  Needle:  Needle type: Tuohy  Needle gauge: 17 G Needle length: 9 cm Needle insertion depth: 5 cm Catheter type: closed end flexible Catheter size: 20 Guage Catheter at skin depth: 9 cm Test dose: negative  Assessment Events: blood not aspirated, injection not painful, no injection resistance, negative IV test and no paresthesia  Additional Notes Reason for block:procedure for pain

## 2017-11-08 ENCOUNTER — Encounter (HOSPITAL_COMMUNITY): Payer: Self-pay | Admitting: Obstetrics and Gynecology

## 2017-11-08 LAB — CBC
HCT: 30.1 % — ABNORMAL LOW (ref 36.0–46.0)
Hemoglobin: 10 g/dL — ABNORMAL LOW (ref 12.0–15.0)
MCH: 27.6 pg (ref 26.0–34.0)
MCHC: 33.2 g/dL (ref 30.0–36.0)
MCV: 83.1 fL (ref 78.0–100.0)
PLATELETS: 244 10*3/uL (ref 150–400)
RBC: 3.62 MIL/uL — AB (ref 3.87–5.11)
RDW: 13.8 % (ref 11.5–15.5)
WBC: 18.8 10*3/uL — ABNORMAL HIGH (ref 4.0–10.5)

## 2017-11-08 LAB — RPR: RPR Ser Ql: NONREACTIVE

## 2017-11-08 MED ORDER — ONDANSETRON HCL 4 MG/2ML IJ SOLN: 4.0000 mg | INTRAMUSCULAR | Status: DC | PRN

## 2017-11-08 MED ORDER — LACTATED RINGERS IV SOLN: INTRAVENOUS | Status: DC

## 2017-11-08 MED ORDER — ACETAMINOPHEN 325 MG PO TABS: 650.0000 mg | ORAL_TABLET | ORAL | Status: DC | PRN

## 2017-11-08 MED ORDER — OXYCODONE HCL 5 MG PO TABS: 10.0000 mg | ORAL_TABLET | ORAL | Status: DC | PRN

## 2017-11-08 MED ORDER — DIPHENHYDRAMINE HCL 25 MG PO CAPS: 25.0000 mg | ORAL_CAPSULE | Freq: Four times a day (QID) | ORAL | Status: DC | PRN

## 2017-11-08 MED ORDER — WITCH HAZEL-GLYCERIN EX PADS: 1.0000 "application " | MEDICATED_PAD | CUTANEOUS | Status: DC | PRN

## 2017-11-08 MED ORDER — SENNOSIDES-DOCUSATE SODIUM 8.6-50 MG PO TABS
2.0000 | ORAL_TABLET | ORAL | Status: DC
  Administered 2017-11-08: 2 via ORAL
  Filled 2017-11-08: qty 2

## 2017-11-08 MED ORDER — DIBUCAINE 1 % RE OINT: 1.0000 "application " | TOPICAL_OINTMENT | RECTAL | Status: DC | PRN

## 2017-11-08 MED ORDER — ONDANSETRON HCL 4 MG PO TABS: 4.0000 mg | ORAL_TABLET | ORAL | Status: DC | PRN

## 2017-11-08 MED ORDER — OXYCODONE HCL 5 MG PO TABS
5.0000 mg | ORAL_TABLET | ORAL | Status: DC | PRN
  Administered 2017-11-08: 5 mg via ORAL
  Filled 2017-11-08: qty 1

## 2017-11-08 MED ORDER — COCONUT OIL OIL: 1.0000 "application " | TOPICAL_OIL | Status: DC | PRN

## 2017-11-08 MED ORDER — TETANUS-DIPHTH-ACELL PERTUSSIS 5-2.5-18.5 LF-MCG/0.5 IM SUSP
0.5000 mL | Freq: Once | INTRAMUSCULAR | Status: DC
  Filled 2017-11-08: qty 0.5

## 2017-11-08 MED ORDER — SIMETHICONE 80 MG PO CHEW: 80.0000 mg | CHEWABLE_TABLET | ORAL | Status: DC | PRN

## 2017-11-08 MED ORDER — ZOLPIDEM TARTRATE 5 MG PO TABS: 5.0000 mg | ORAL_TABLET | Freq: Every evening | ORAL | Status: DC | PRN

## 2017-11-08 MED ORDER — BENZOCAINE-MENTHOL 20-0.5 % EX AERO
1.0000 "application " | INHALATION_SPRAY | CUTANEOUS | Status: DC | PRN
  Administered 2017-11-08: 1 via TOPICAL
  Filled 2017-11-08: qty 56

## 2017-11-08 MED ORDER — PRENATAL MULTIVITAMIN CH
1.0000 | ORAL_TABLET | Freq: Every day | ORAL | Status: DC
  Administered 2017-11-08 – 2017-11-09 (×2): 1 via ORAL
  Filled 2017-11-08 (×2): qty 1

## 2017-11-08 MED ORDER — IBUPROFEN 600 MG PO TABS
600.0000 mg | ORAL_TABLET | Freq: Four times a day (QID) | ORAL | Status: DC
  Administered 2017-11-08 – 2017-11-09 (×6): 600 mg via ORAL
  Filled 2017-11-08 (×6): qty 1

## 2017-11-08 NOTE — Lactation Note (Signed)
This note was copied from a baby's chart. Lactation Consultation Note  Patient Name: [name] Reason for consult: Initial assessment;Term P3, 2 hour female infant  Mom is experienced BF, BF other 2 children for one year. Mom was BF before LC entered room, in the cradle position, LC notice audible swallowing and infant's mouth was open with wide gape and lower jaw was extended downward. Latch-10. Mom is knowledgeable about STS, breast compressions and message. Reviewed hunger cues w/ Mom. LC discussed I&O. Mom encouraged to feed baby 8-12 times/24 hours and with feeding cues.  Reviewed Baby & Me book's Breastfeeding Basics.  Mom made aware of O/P services, breastfeeding support groups, community resources, and our phone # for post-discharge questions.   Maternal Data Formula Feeding for Exclusion: No Has patient been taught Hand Expression?: Yes(Mom [facility]ostrated hand expression to [facility]) Does the patient have breastfeeding experience prior to this delivery?: Yes  Feeding Feeding Type: Breast Fed Length of feed: 15 min(Mom was feeding before LC entered room.)  LATCH Score Latch: Grasps breast easily, tongue down, lips flanged, rhythmical sucking.  Audible Swallowing: Spontaneous and intermittent  Type of Nipple: Everted at rest and after stimulation  Comfort (Breast/Nipple): Soft / non-tender  Hold (Positioning): No assistance needed to correctly position infant at breast.  LATCH Score: 10  Interventions Interventions: Breast feeding basics reviewed;Skin to skin;Hand express;Breast massage;Breast compression  Lactation Tools Discussed/Used WIC Program: Yes   Consult Status      Gabriella EarthlyRobin Jarely Evans Evans, 5:11 AM

## 2017-11-08 NOTE — Lactation Note (Signed)
This note was copied from a baby's chart. Lactation Consultation Note  Patient Name: Girl Hayden Rasmussentedal Nolet ZOXWR'UToday's Date: 11/08/2017 Reason for consult: Follow-up assessment;Term;Other (Comment)(exp breastfeeder ) Baby is 15 hours old  As LC entered the room baby having a hearing screen done and mom resting in bed.  Per mom  breast feeding is going well and last fed around 3 pm.  Per mom breast are fuller and feel like her milk is coming in. LC mentioned tonight  If she wakes up to breast that are engorged to ask for ice packs and hand pump .    Maternal Data    Feeding Feeding Type: (pe rmom last fed around 3 pm , swallows ) Length of feed: 25 min  LATCH Score                   Interventions Interventions: Breast feeding basics reviewed  Lactation Tools Discussed/Used     Consult Status Consult Status: Follow-up Date: 11/09/17 Follow-up type: In-patient    Matilde SprangMargaret Ann Raynald Rouillard 11/08/2017, 6:24 PM

## 2017-11-08 NOTE — Progress Notes (Signed)
Patient ID: [name], female   DOB: [birth year], 27 y.o.   MRN: [id number] PPD#0 Pt doing well. Pain well controlled. Lochia mild. Denies any CP, SOB or HA. Bonding well with baby VSS  Plan: PPD#0 - routine pp care

## 2017-11-08 NOTE — Anesthesia Postprocedure Evaluation (Signed)
Anesthesia Post Note  Patient: [name]  Procedure(s) Performed: AN AD HOC LABOR EPIDURAL     Patient location during evaluation: Mother Baby Anesthesia Type: Epidural Level of consciousness: awake and alert Pain management: pain level controlled Vital Signs Assessment: post-procedure vital signs reviewed and stable Respiratory status: spontaneous breathing, nonlabored ventilation and respiratory function stable Cardiovascular status: stable Postop Assessment: no headache, no backache, epidural receding, patient able to bend at knees, no apparent nausea or vomiting, able to ambulate and adequate PO intake Anesthetic complications: no    Last Vitals:  Vitals:   11/08/17 0440 11/08/17 0545  BP: 128/65 113/61  Pulse: 86 85  Resp: 16 16  Temp: 36.8 C 37.1 C  SpO2: 98%     Last Pain:  Vitals:   11/08/17 0800  TempSrc:   PainSc: 4    Pain Goal: Patients Stated Pain Goal: 3 (11/08/17 0115)               Laban EmperorMalinova,Ruddy Swire Hristova

## 2017-11-08 NOTE — Progress Notes (Signed)
At 0740 Pt. Assisted to the bathroom without difficulty. Pt unable to void, ambulated back to bed without difficulty. Pt. Complaining of abdominal discomfort. Fundus Firm displaced to the right and 2 above the umbilicus. Pt bladder scanned at 0755, bladder scan value 973. Pt. I & O cath. At 0800 for 1200 cc of clear yellow urine. Fundus Firm at Umbilicus, lochia small. Pt encouraged to empty her bladder every 2-3 hours, and  to call nurse with difficulties.

## 2017-11-09 MED ORDER — OXYCODONE HCL 5 MG PO TABS: 5.0000 mg | ORAL_TABLET | ORAL | 0 refills | Status: AC | PRN

## 2017-11-09 MED ORDER — IBUPROFEN 600 MG PO TABS: 600.0000 mg | ORAL_TABLET | Freq: Four times a day (QID) | ORAL | 0 refills | Status: AC

## 2017-11-09 NOTE — Progress Notes (Signed)
PPD #1 No problems, wants to go home Afeb, VSS Fundus firm, NT at U-1 Continue routine postpartum care, d/c home 

## 2017-11-09 NOTE — Discharge Instructions (Signed)
As per discharge pamphlet °

## 2017-11-09 NOTE — Discharge Summary (Signed)
OB Discharge Summary     Patient Name: [name] DOB: 10-26-1990 MRN: [id number]  Date of admission: 11/07/2017 Delivering MD: Sherian Rein   Date of discharge: 11/09/2017  Admitting diagnosis: PREG. BLEEDING Intrauterine pregnancy: [redacted]w[redacted]d     Secondary diagnosis:  Principal Problem:   SVD (spontaneous vaginal delivery) Active Problems:   Normal pregnancy in third trimester   Normal pregnancy in multigravida in third trimester      Discharge diagnosis: Term Pregnancy Delivered                                   Hospital course:  Onset of Labor With Vaginal Delivery     27 y.o. yo [id number] at [redacted]w[redacted]d was admitted in Active Labor on 11/07/2017. Patient had an uncomplicated labor course as follows:  Membrane Rupture Time/Date: 9:04 PM ,11/07/2017   Intrapartum Procedures: Episiotomy: None [1]                                         Lacerations:  2nd degree [3];Labial [10];Perineal [11]  Patient had a delivery of a Viable infant. 11/08/2017  Information for the patient's newborn:  D[place], [place] [[id number]]  Delivery Method: Vaginal, Spontaneous(Filed from Delivery Summary)    Pateint had an uncomplicated postpartum course.  She is ambulating, tolerating a regular diet, passing flatus, and urinating well. Patient is discharged home in stable condition on 11/09/17.   Physical exam  Vitals:   11/08/17 1000 11/08/17 1330 11/08/17 1940 11/09/17 0541  BP: (!) 107/53 (!) 112/56 120/65 104/64  Pulse: 89 79 70 73  Resp: 18 18 18 18   Temp: 98.2 F (36.8 C) 98.4 F (36.9 C) 98.2 F (36.8 C)   TempSrc: Oral Oral Oral   SpO2: 100% 100% 100%   Weight:      Height:       General: alert Lochia: appropriate Uterine Fundus: firm  Labs: Lab Results  Component Value Date   WBC 18.8 (H) 11/08/2017   HGB 10.0 (L) 11/08/2017   HCT 30.1 (L) 11/08/2017   MCV 83.1 11/08/2017   PLT 244 11/08/2017   CMP Latest Ref Rng & Units 10/23/2017  Glucose 70 - 99 mg/dL 92  BUN 6 -  20 mg/dL 7  Creatinine 5.36 - 6.44 mg/dL 0.34(V)  Sodium 425 - 956 mmol/L 136  Potassium 3.5 - 5.1 mmol/L 3.8  Chloride 98 - 111 mmol/L 106  CO2 22 - 32 mmol/L 19(L)  Calcium 8.9 - 10.3 mg/dL 9.0  Total Protein 6.5 - 8.1 g/dL 6.4(L)  Total Bilirubin 0.3 - 1.2 mg/dL 0.6  Alkaline Phos 38 - 126 U/L 139(H)  AST 15 - 41 U/L 17  ALT 0 - 44 U/L 13    Discharge instruction: per After Visit Summary and "Baby and Me Booklet".  After visit meds:  Allergies as of 11/09/2017   No Known Allergies     Medication List    TAKE these medications   ibuprofen 600 MG tablet Commonly known as:  ADVIL,MOTRIN Take 1 tablet (600 mg total) by mouth every 6 (six) hours.   oxyCODONE 5 MG immediate release tablet Commonly known as:  Oxy IR/ROXICODONE Take 1 tablet (5 mg total) by mouth every 4 (four) hours as needed for severe pain.   prenatal multivitamin Tabs tablet Take 1 tablet by  mouth daily at 12 noon.       Diet: routine diet  Activity: Advance as tolerated. Pelvic rest for 6 weeks.   Outpatient follow up:6 weeks  Newborn Data: Live born female  Birth Weight: 7 lb 12.3 oz (3524 g) APGAR: 8, 9  Newborn Delivery   Birth date/time:  11/08/2017 02:49:00 Delivery type:  Vaginal, Spontaneous     Baby Feeding: Breast Disposition:home with mother   11/09/2017 Zenaida Nieceodd D Ji Fairburn, MD

## 2019-07-17 ENCOUNTER — Institutional Professional Consult (permissible substitution): Payer: Medicaid Other | Admitting: Plastic Surgery

## 2019-08-18 ENCOUNTER — Other Ambulatory Visit: Payer: Self-pay

## 2019-08-18 ENCOUNTER — Emergency Department (HOSPITAL_BASED_OUTPATIENT_CLINIC_OR_DEPARTMENT_OTHER)
Admission: EM | Admit: 2019-08-18 | Discharge: 2019-08-19 | Disposition: A | Discharge status: Home / Self Care | Payer: Medicaid Other | Attending: Emergency Medicine | Admitting: Emergency Medicine

## 2019-08-18 ENCOUNTER — Encounter (HOSPITAL_BASED_OUTPATIENT_CLINIC_OR_DEPARTMENT_OTHER): Payer: Self-pay | Admitting: *Deleted

## 2019-08-18 DIAGNOSIS — S060X0A Concussion without loss of consciousness, initial encounter: Secondary | ICD-10-CM | POA: Insufficient documentation

## 2019-08-18 DIAGNOSIS — Y9389 Activity, other specified: Secondary | ICD-10-CM | POA: Insufficient documentation

## 2019-08-18 DIAGNOSIS — Y929 Unspecified place or not applicable: Secondary | ICD-10-CM | POA: Insufficient documentation

## 2019-08-18 DIAGNOSIS — R112 Nausea with vomiting, unspecified: Secondary | ICD-10-CM | POA: Insufficient documentation

## 2019-08-18 DIAGNOSIS — Y999 Unspecified external cause status: Secondary | ICD-10-CM | POA: Insufficient documentation
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
## 2019-08-19 ENCOUNTER — Emergency Department (HOSPITAL_BASED_OUTPATIENT_CLINIC_OR_DEPARTMENT_OTHER): Payer: Medicaid Other

## 2019-08-19 MED ORDER — ONDANSETRON 4 MG PO TBDP
4.0000 mg | ORAL_TABLET | Freq: Once | ORAL | Status: AC
  Administered 2019-08-19: 4 mg via ORAL
  Filled 2019-08-19: qty 1

## 2019-08-19 MED ORDER — HYDROCODONE-ACETAMINOPHEN 5-325 MG PO TABS
1.0000 | ORAL_TABLET | Freq: Once | ORAL | Status: DC
  Filled 2019-08-19: qty 1

## 2019-08-19 MED ORDER — ONDANSETRON 4 MG PO TBDP: 4.0000 mg | ORAL_TABLET | Freq: Three times a day (TID) | ORAL | 0 refills | Status: AC | PRN

## 2019-08-19 MED ORDER — ACETAMINOPHEN 325 MG PO TABS
650.0000 mg | ORAL_TABLET | Freq: Once | ORAL | Status: AC
  Administered 2019-08-19: 650 mg via ORAL

## 2019-08-19 MED ORDER — NAPROXEN 500 MG PO TABS: 500.0000 mg | ORAL_TABLET | Freq: Two times a day (BID) | ORAL | 0 refills | Status: AC

## 2019-08-19 MED ORDER — ACETAMINOPHEN 325 MG PO TABS
ORAL_TABLET | ORAL | Status: AC
  Filled 2019-08-19: qty 2

## 2019-08-19 NOTE — ED Notes (Signed)
Pt states she has a safe place to go after discharge.
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
